# Patient Record
Sex: Male | Born: 1950
Health system: Southern US, Community
[De-identification: ages and names within clinical notes are randomized; demographics above are authoritative.]

## PROBLEM LIST (undated history)

## (undated) DIAGNOSIS — K219 Gastro-esophageal reflux disease without esophagitis: Secondary | ICD-10-CM

## (undated) DIAGNOSIS — N400 Enlarged prostate without lower urinary tract symptoms: Secondary | ICD-10-CM

## (undated) DIAGNOSIS — N200 Calculus of kidney: Secondary | ICD-10-CM

## (undated) DIAGNOSIS — I10 Essential (primary) hypertension: Secondary | ICD-10-CM

## (undated) HISTORY — PX: THYROID SURGERY: SHX805

## (undated) HISTORY — PX: ESOPHAGOGASTRODUODENOSCOPY: SHX1529

## (undated) HISTORY — DX: Essential (primary) hypertension: I10

## (undated) HISTORY — PX: HIATAL HERNIA REPAIR: SHX195

## (undated) HISTORY — DX: Benign prostatic hyperplasia without lower urinary tract symptoms: N40.0

## (undated) HISTORY — DX: Calculus of kidney: N20.0

## (undated) HISTORY — DX: Gastro-esophageal reflux disease without esophagitis: K21.9

---

## 2001-10-16 ENCOUNTER — Other Ambulatory Visit: Admission: RE | Admit: 2001-10-16 | Discharge: 2001-10-16 | Payer: Self-pay | Admitting: General Surgery

## 2002-01-09 ENCOUNTER — Ambulatory Visit (HOSPITAL_COMMUNITY): Admission: RE | Admit: 2002-01-09 | Discharge: 2002-01-10 | Payer: Self-pay | Admitting: Otolaryngology

## 2005-03-24 ENCOUNTER — Ambulatory Visit: Payer: Self-pay | Admitting: Cardiology

## 2005-03-31 ENCOUNTER — Inpatient Hospital Stay (HOSPITAL_BASED_OUTPATIENT_CLINIC_OR_DEPARTMENT_OTHER): Admission: RE | Admit: 2005-03-31 | Discharge: 2005-03-31 | Payer: Self-pay | Admitting: Cardiology

## 2005-03-31 ENCOUNTER — Ambulatory Visit: Payer: Self-pay | Admitting: Internal Medicine

## 2005-04-07 ENCOUNTER — Ambulatory Visit: Payer: Self-pay | Admitting: Cardiology

## 2008-05-28 HISTORY — PX: COLONOSCOPY: SHX174

## 2013-09-20 ENCOUNTER — Encounter: Payer: Self-pay | Admitting: Gastroenterology

## 2013-09-20 ENCOUNTER — Ambulatory Visit (INDEPENDENT_AMBULATORY_CARE_PROVIDER_SITE_OTHER): Payer: BC Managed Care – PPO | Admitting: Gastroenterology

## 2013-09-20 ENCOUNTER — Encounter (INDEPENDENT_AMBULATORY_CARE_PROVIDER_SITE_OTHER): Payer: Self-pay

## 2013-09-20 VITALS — BP 130/80 | HR 70 | Temp 97.6°F | Ht 70.0 in | Wt 254.8 lb

## 2013-09-20 DIAGNOSIS — K625 Hemorrhage of anus and rectum: Secondary | ICD-10-CM

## 2013-09-20 MED ORDER — PEG-KCL-NACL-NASULF-NA ASC-C 100 G PO SOLR: 1.0000 | ORAL | Status: DC

## 2013-09-20 NOTE — Progress Notes (Signed)
Primary Care Physician:  Ignatius Specking., MD  Primary Gastroenterologist:  Jonette Eva, MD   Chief Complaint  Patient presents with  . Hemorrhoids    HPI:  Stephen Hamilton is a 62 y.o. male here for further evaluation of rectal bleeding/hemorrhoids. Patient notes bleeding with and without BMs. May happen with walking/exercise. He has had couple of colonoscopies in the past, denies polyps. We have requested records. Normal colonoscopy with Dr. Erskine Speed in 2009. BM regular. No significant rectal pain. Heartburn controlled on PPI, 10 years of medication. Pepcid AC as needed. Prior EGD, patient denies Barrett's. No dysphagia.  Bleeding has become very frequent over the past 12 months. Has been intermittent for about 2-3 years. Has failed multiple hemorrhoidal preparations.   Current Outpatient Prescriptions  Medication Sig Dispense Refill  . JALYN 0.5-0.4 MG CAPS daily.       Marland Kitchen lisinopril (PRINIVIL,ZESTRIL) 20 MG tablet Take 20 mg by mouth daily.      Marland Kitchen omeprazole (PRILOSEC OTC) 20 MG tablet Take 20 mg by mouth daily.       No current facility-administered medications for this visit.    Allergies as of 09/20/2013  . (No Known Allergies)    Past Medical History  Diagnosis Date  . BPH (benign prostatic hyperplasia)   . HTN (hypertension)   . Kidney stone   . GERD (gastroesophageal reflux disease)     Past Surgical History  Procedure Laterality Date  . Hiatal hernia repair    . Thyroid surgery    . Colonoscopy  05/2008    Dr. Gabriel Cirri, normal  . Esophagogastroduodenoscopy      MMH per patient, unremarkable    Family History  Problem Relation Age of Onset  . Colon cancer Neg Hx     History   Social History  . Marital Status: Married    Spouse Name: N/A    Number of Children: N/A  . Years of Education: N/A   Occupational History  . retired Corporate treasurer   Social History Main Topics  . Smoking status: Current Every Day Smoker -- 1.00 packs/day    Types:  Cigarettes  . Smokeless tobacco: Not on file  . Alcohol Use: No  . Drug Use: No  . Sexual Activity: Not on file   Other Topics Concern  . Not on file   Social History Narrative  . No narrative on file      ROS:  General: Negative for anorexia, weight loss, fever, chills, fatigue, weakness. Eyes: Negative for vision changes.  ENT: Negative for hoarseness, difficulty swallowing , nasal congestion. CV: Negative for chest pain, angina, palpitations, dyspnea on exertion, peripheral edema.  Respiratory: Negative for dyspnea at rest, dyspnea on exertion, cough, sputum, wheezing.  GI: See history of present illness. GU:  Negative for dysuria, hematuria, urinary incontinence, + urinary frequency, + nocturnal urination.  MS: Negative for joint pain, low back pain.  Derm: Negative for rash or itching.  Neuro: Negative for weakness, abnormal sensation, seizure, frequent headaches, memory loss, confusion.  Psych: Negative for anxiety, depression, suicidal ideation, hallucinations.  Endo: Negative for unusual weight change.  Heme: Negative for bruising or bleeding. Allergy: Negative for rash or hives.    Physical Examination:  BP 130/80  Pulse 70  Temp(Src) 97.6 F (36.4 C) (Oral)  Ht 5\' 10"  (1.778 m)  Wt 254 lb 12.8 oz (115.577 kg)  BMI 36.56 kg/m2   General: Well-nourished, well-developed in no acute distress.  Head: Normocephalic, atraumatic.   Eyes: Conjunctiva pink, no  icterus. Mouth: Oropharyngeal mucosa moist and pink , no lesions erythema or exudate. Neck: Supple without thyromegaly, masses, or lymphadenopathy.  Lungs: Clear to auscultation bilaterally.  Heart: Regular rate and rhythm, no murmurs rubs or gallops.  Abdomen: Bowel sounds are normal, nontender, nondistended, no hepatosplenomegaly or masses, no abdominal bruits or    hernia , no rebound or guarding.   Rectal: Deferred Extremities: No lower extremity edema. No clubbing or deformities.  Neuro: Alert and  oriented x 4 , grossly normal neurologically.  Skin: Warm and dry, no rash or jaundice.   Psych: Alert and cooperative, normal mood and affect.

## 2013-09-20 NOTE — Patient Instructions (Signed)
1. We have scheduled you for a colonoscopy with possible hemorrhoid banding.

## 2013-09-20 NOTE — Assessment & Plan Note (Signed)
Chronic intermittent rectal bleeding likely benign anorectal source. His last colonoscopy is more than 5 years ago without reported hemorrhoids. We'll plan on colonoscopy with possible hemorrhoid banding times procedure as discussed with Dr. Darrick Penna.  I have discussed the risks, alternatives, benefits with regards to but not limited to the risk of reaction to medication, bleeding, infection, perforation and the patient is agreeable to proceed. Written consent to be obtained.

## 2013-09-23 NOTE — Progress Notes (Signed)
cc'd to PCP °

## 2013-09-24 ENCOUNTER — Encounter (HOSPITAL_COMMUNITY): Payer: Self-pay | Admitting: Pharmacy Technician

## 2013-09-25 ENCOUNTER — Encounter (HOSPITAL_COMMUNITY): Payer: Self-pay | Admitting: *Deleted

## 2013-09-25 ENCOUNTER — Encounter (HOSPITAL_COMMUNITY)
Admission: RE | Disposition: A | Discharge status: Home / Self Care | Payer: Self-pay | Source: Ambulatory Visit | Attending: Gastroenterology

## 2013-09-25 ENCOUNTER — Ambulatory Visit (HOSPITAL_COMMUNITY)
Admission: RE | Admit: 2013-09-25 | Discharge: 2013-09-25 | Disposition: A | Discharge status: Home / Self Care | Payer: BC Managed Care – PPO | Source: Ambulatory Visit | Attending: Gastroenterology | Admitting: Gastroenterology

## 2013-09-25 DIAGNOSIS — D126 Benign neoplasm of colon, unspecified: Secondary | ICD-10-CM

## 2013-09-25 DIAGNOSIS — K625 Hemorrhage of anus and rectum: Secondary | ICD-10-CM

## 2013-09-25 DIAGNOSIS — I10 Essential (primary) hypertension: Secondary | ICD-10-CM | POA: Insufficient documentation

## 2013-09-25 DIAGNOSIS — K648 Other hemorrhoids: Secondary | ICD-10-CM | POA: Insufficient documentation

## 2013-09-25 HISTORY — PX: COLONOSCOPY: SHX5424

## 2013-09-25 HISTORY — PX: HEMORRHOID BANDING: SHX5850

## 2013-09-25 SURGERY — COLONOSCOPY
Anesthesia: Moderate Sedation

## 2013-09-25 MED ORDER — STERILE WATER FOR IRRIGATION IR SOLN
Status: DC | PRN
  Administered 2013-09-25: 13:00:00

## 2013-09-25 MED ORDER — MIDAZOLAM HCL 5 MG/5ML IJ SOLN
INTRAMUSCULAR | Status: AC
  Filled 2013-09-25: qty 10

## 2013-09-25 MED ORDER — MIDAZOLAM HCL 5 MG/5ML IJ SOLN
INTRAMUSCULAR | Status: DC | PRN
  Administered 2013-09-25 (×2): 2 mg via INTRAVENOUS
  Administered 2013-09-25: 1 mg via INTRAVENOUS

## 2013-09-25 MED ORDER — SODIUM CHLORIDE 0.9 % IV SOLN
INTRAVENOUS | Status: DC
  Administered 2013-09-25: 11:00:00 via INTRAVENOUS

## 2013-09-25 MED ORDER — MEPERIDINE HCL 100 MG/ML IJ SOLN
INTRAMUSCULAR | Status: DC | PRN
  Administered 2013-09-25 (×3): 25 mg via INTRAVENOUS

## 2013-09-25 MED ORDER — MEPERIDINE HCL 100 MG/ML IJ SOLN
INTRAMUSCULAR | Status: AC
  Filled 2013-09-25: qty 2

## 2013-09-25 NOTE — Op Note (Addendum)
[facility] [address] [place], [address]   COLONOSCOPY PROCEDURE REPORT  PATIENT: Stephen Hamilton, Stephen Hamilton  MR#: [id number] BIRTHDATE: Aug 14, 1951 , 62  yrs. old GENDER: Male ENDOSCOPIST: Jonette Eva, MD REFERRED XB:JYNWG Sherril Croon, M.D. PROCEDURE DATE:  09/25/2013 PROCEDURE:   Colonoscopy with snare polypectomy and Hemorrhoidectomy via banding, clips or ligation INDICATIONS:Rectal Bleeding. MEDICATIONS: Demerol 50 mg IV and Versed 5 mg IV  DESCRIPTION OF PROCEDURE:    Physical exam was performed.  Informed consent was obtained from the patient after explaining the benefits, risks, and alternatives to procedure.  The patient was connected to monitor and placed in left lateral position. Continuous oxygen was provided by nasal cannula and IV medicine administered through an indwelling cannula.  After administration of sedation and rectal exam, the patients rectum was intubated and the EC-[id number] ([id number]) and [id number] ([id number])  colonoscope was advanced under direct visualization to the ileum.  The scope was removed slowly by carefully examining the color, texture, anatomy, and integrity mucosa on the way out.  The patient was recovered in endoscopy and discharged home in satisfactory condition.    COLON FINDINGS: The mucosa appeared normal in the terminal ileum.  , A single polyp measuring 8 mm in size was found in the ascending colon.  A polypectomy was performed using snare cautery.  , The colon mucosa was otherwise normal.  , Large internal hemorrhoids were found.  3 bands placed successfully. and The colon IS SLIGHTLY redundant.  Manual abdominal counter-pressure was used to reach the cecum.  PREP QUALITY: excellent.  CECAL W/D TIME: 12 minutes COMPLICATIONS: None  ENDOSCOPIC IMPRESSION: 1.   The colon was redundant 2.   ONE COLON POLYP REMOVED 3.   RECTAL BLEEDING DUE TO Large internal hemorrhoids   RECOMMENDATIONS: CALL [phone] IF YOU HAVE A FEVER, A  LARGE AMOUNT OF BLEEDING, OR DIFFICULTY URINATING and go to the ED immediately. DRINK WATER TO KEEP URINE LIGHT YELLOW. MAY USE NAPROXEN OR IBUPROFEN TWICE DAILY FOR RECTAL DISCOMFORT. TYLENOL AS NEED FOR ADDITIONAL PAIN RELIEF. MAY USE COLACE TWICE DAILY AS NEEDED TO SOFTEN STOOL. BIOPSY RESULT WILL BE BACK IN 7 DAYS.  FOLLOW A LOW RESIDUE DIET UNTIL NOV 12. FOLLOW UP NOV 19 AT 1130 PM. Next colonoscopy in 10 years.       _______________________________ Rosalie DoctorJonette Eva, MD 10/01/2013 1:47 PM Revised: 10/01/2013 1:47 PM    PATIENT NAME:  Stephen Hamilton, Stephen Hamilton MR#: [id number]

## 2013-09-25 NOTE — Progress Notes (Signed)
REVIEWED.  

## 2013-09-25 NOTE — H&P (Signed)
  Primary Care Physician:  Ignatius Specking., MD Primary Gastroenterologist:  Dr. Darrick Penna  Pre-Procedure History & Physical: HPI:  Stephen Hamilton is a 62 y.o. male here for  BRBPR.  Past Medical History  Diagnosis Date  . BPH (benign prostatic hyperplasia)   . HTN (hypertension)   . Kidney stone   . GERD (gastroesophageal reflux disease)     Past Surgical History  Procedure Laterality Date  . Hiatal hernia repair    . Thyroid surgery    . Colonoscopy  05/2008    Dr. Gabriel Cirri, normal  . Esophagogastroduodenoscopy      MMH per patient, unremarkable    Prior to Admission medications   Medication Sig Start Date End Date Taking? Authorizing Provider  JALYN 0.5-0.4 MG CAPS Take 1 capsule by mouth daily.  08/29/13  Yes Historical Provider, MD  lisinopril (PRINIVIL,ZESTRIL) 20 MG tablet Take 20 mg by mouth daily.   Yes Historical Provider, MD  omeprazole (PRILOSEC OTC) 20 MG tablet Take 20 mg by mouth daily.   Yes Historical Provider, MD    Allergies as of 09/20/2013  . (No Known Allergies)    Family History  Problem Relation Age of Onset  . Colon cancer Neg Hx     History   Social History  . Marital Status: Married    Spouse Name: N/A    Number of Children: N/A  . Years of Education: N/A   Occupational History  . retired Corporate treasurer   Social History Main Topics  . Smoking status: Current Every Day Smoker -- 1.00 packs/day    Types: Cigarettes  . Smokeless tobacco: Not on file  . Alcohol Use: No  . Drug Use: No  . Sexual Activity: Not on file   Other Topics Concern  . Not on file   Social History Narrative  . No narrative on file    Review of Systems: See HPI, otherwise negative ROS   Physical Exam: BP 146/92  Pulse 68  Temp(Src) 98.7 F (37.1 C) (Oral)  Resp 16  Ht 5\' 10"  (1.778 m)  Wt 254 lb (115.214 kg)  BMI 36.45 kg/m2  SpO2 98% General:   Alert,  pleasant and cooperative in NAD Head:  Normocephalic and atraumatic. Neck:  Supple; Lungs:   Clear throughout to auscultation.    Heart:  Regular rate and rhythm. Abdomen:  Soft, nontender and nondistended. Normal bowel sounds, without guarding, and without rebound.   Neurologic:  Alert and  oriented x4;  grossly normal neurologically.  Impression/Plan:    BRBPR  PLAN: TCS/?hemorrhoid banding TODAY

## 2013-09-27 ENCOUNTER — Encounter (HOSPITAL_COMMUNITY): Payer: Self-pay | Admitting: Gastroenterology

## 2013-10-07 ENCOUNTER — Telehealth: Payer: Self-pay | Admitting: Gastroenterology

## 2013-10-07 NOTE — Telephone Encounter (Signed)
Please call pt. Stephen Hamilton had A simple adenoma removed from HIS colon.    DRINK WATER TO KEEP URINE LIGHT YELLOW.  USE NAPROXEN OR IBUPROFEN TWICE DAILY FOR RECTAL DISCOMFORT. TYLENOL AS NEED FOR ADDITIONAL PAIN RELIEF.  USE COLACE TWICE DAILY AS NEEDED TO SOFTEN STOOL.  FOLLOW A LOW RESIDUE DIET UNTIL NOV 12 THEN FOLLOW A HIGH FIBER DIET.  FOLLOW UP NOV 19 AT 1130 PM.  Next colonoscopy in 10 years.

## 2013-10-07 NOTE — Telephone Encounter (Signed)
LM for pt to call

## 2013-10-08 NOTE — Telephone Encounter (Signed)
Pt returned call and was informed of results.  

## 2013-10-16 ENCOUNTER — Encounter: Payer: Self-pay | Admitting: Gastroenterology

## 2013-10-16 ENCOUNTER — Ambulatory Visit (INDEPENDENT_AMBULATORY_CARE_PROVIDER_SITE_OTHER): Payer: BC Managed Care – PPO | Admitting: Gastroenterology

## 2013-10-16 ENCOUNTER — Other Ambulatory Visit: Payer: Self-pay | Admitting: Gastroenterology

## 2013-10-16 VITALS — BP 147/93 | HR 74 | Temp 98.5°F | Ht 70.0 in | Wt 256.8 lb

## 2013-10-16 DIAGNOSIS — K648 Other hemorrhoids: Secondary | ICD-10-CM

## 2013-10-16 DIAGNOSIS — N400 Enlarged prostate without lower urinary tract symptoms: Secondary | ICD-10-CM

## 2013-10-16 DIAGNOSIS — D126 Benign neoplasm of colon, unspecified: Secondary | ICD-10-CM

## 2013-10-16 NOTE — Assessment & Plan Note (Signed)
SX NOT IDEALLY CONTROLLED ON RAPAFLO OR JAYLYN.  REFER TO [facility]. OPV IN 1-2 YEARS

## 2013-10-16 NOTE — Progress Notes (Signed)
Cc PCP 

## 2013-10-16 NOTE — Assessment & Plan Note (Signed)
EAT FIBER. DISCUSSED EXAMPLES OF HIGH FIBER FOODS. DRINK WATER TCSIN 10 YEARS

## 2013-10-16 NOTE — Assessment & Plan Note (Signed)
SX RESOLVED AFTER TCS/IH BANDING X3.  DISCUSSED INFO ON NEED FOR FUTURE INTERVENTION AND HOW TO AVOID HEMORRHOID FLARES DRINK WATER EAT FIBER OPV IN 1 YEAR

## 2013-10-16 NOTE — Patient Instructions (Signed)
CONTINUE YOUR WEIGHT LOSS EFFORTS. A WEIGHT OF 200-210 LBS WOULD BE BETTER FOR YOUR HEALTH.  DRINK WATER TO KEEP YOUR URINE LIGHT YELLOW.  FOLLOW A HIGH FIBER/LOW FAT DIET. SEE INFO BELOW.  I WILL MAKE THE REFERRAL TO [facility].  FOLLOW UP IN ONE YEAR.  Hemorrhoids Hemorrhoids are dilated (enlarged) veins around the rectum. Sometimes clots will form in the veins. This makes them swollen and painful. These are called thrombosed hemorrhoids.  Causes of hemorrhoids include:  Constipation.   Straining to have a bowel movement.   HEAVY LIFTING  HOME CARE INSTRUCTIONS  Eat a well balanced diet and drink 6 to 8 glasses of water every day to avoid constipation. You may also use a bulk laxative.   Avoid straining to have bowel movements. DO NOT SIT ON THE TOILET MORE THAN 5 MINUTES.  Keep anal area dry and clean.   Do not use a donut shaped pillow or sit on the toilet for long periods. This increases blood pooling and pain.   Move your bowels when your body has the urge; this will require less straining and will decrease pain and pressure.   High-Fiber Diet A high-fiber diet changes your normal diet to include more whole grains, legumes, fruits, and vegetables. Changes in the diet involve replacing refined carbohydrates with unrefined foods. The calorie level of the diet is essentially unchanged. The Dietary Reference Intake (recommended amount) for adult males is 38 grams per day. For adult females, it is 25 grams per day. Pregnant and lactating women should consume 28 grams of fiber per day. Fiber is the intact part of a plant that is not broken down during digestion. Functional fiber is fiber that has been isolated from the plant to provide a beneficial effect in the body. PURPOSE  Increase stool bulk.   Ease and regulate bowel movements.   Lower cholesterol.  INDICATIONS THAT YOU NEED MORE FIBER  Constipation and hemorrhoids.   Uncomplicated diverticulosis (intestine  condition) and irritable bowel syndrome.   Weight management.   As a protective measure against hardening of the arteries (atherosclerosis), diabetes, and cancer.   GUIDELINES FOR INCREASING FIBER IN THE DIET  Start adding fiber to the diet slowly. A gradual increase of about 5 more grams (2 slices of whole-wheat bread, 2 servings of most fruits or vegetables, or 1 bowl of high-fiber cereal) per day is best. Too rapid an increase in fiber may result in constipation, flatulence, and bloating.   Drink enough water and fluids to keep your urine clear or pale yellow. Water, juice, or caffeine-free drinks are recommended. Not drinking enough fluid may cause constipation.   Eat a variety of high-fiber foods rather than one type of fiber.   Try to increase your intake of fiber through using high-fiber foods rather than fiber pills or supplements that contain small amounts of fiber.   The goal is to change the types of food eaten. Do not supplement your present diet with high-fiber foods, but replace foods in your present diet.  INCLUDE A VARIETY OF FIBER SOURCES  Replace refined and processed grains with whole grains, canned fruits with fresh fruits, and incorporate other fiber sources. White rice, white breads, and most bakery goods contain little or no fiber.   Brown whole-grain rice, buckwheat oats, and many fruits and vegetables are all good sources of fiber. These include: broccoli, Brussels sprouts, cabbage, cauliflower, beets, sweet potatoes, white potatoes (skin on), carrots, tomatoes, eggplant, squash, berries, fresh fruits, and dried fruits.  Cereals appear to be the richest source of fiber. Cereal fiber is found in whole grains and bran. Bran is the fiber-rich outer coat of cereal grain, which is largely removed in refining. In whole-grain cereals, the bran remains. In breakfast cereals, the largest amount of fiber is found in those with "bran" in their names. The fiber content is  sometimes indicated on the label.   You may need to include additional fruits and vegetables each day.   In baking, for 1 cup white flour, you may use the following substitutions:   1 cup whole-wheat flour minus 2 tablespoons.   1/2 cup white flour plus 1/2 cup whole-wheat flour.   Low-Fat Diet BREADS, CEREALS, PASTA, RICE, DRIED PEAS, AND BEANS These products are high in carbohydrates and most are low in fat. Therefore, they can be increased in the diet as substitutes for fatty foods. They too, however, contain calories and should not be eaten in excess. Cereals can be eaten for snacks as well as for breakfast.   FRUITS AND VEGETABLES It is good to eat fruits and vegetables. Besides being sources of fiber, both are rich in vitamins and some minerals. They help you get the daily allowances of these nutrients. Fruits and vegetables can be used for snacks and desserts.  MEATS Limit lean meat, chicken, [place], and fish to no more than 6 ounces per day. Beef, Pork, and Lamb Use lean cuts of beef, pork, and lamb. Lean cuts include:  Extra-lean ground beef.  Arm roast.  Sirloin tip.  Center-cut ham.  Round steak.  Loin chops.  Rump roast.  Tenderloin.  Trim all fat off the outside of meats before cooking. It is not necessary to severely decrease the intake of red meat, but lean choices should be made. Lean meat is rich in protein and contains a highly absorbable form of iron. Premenopausal women, in particular, should avoid reducing lean red meat because this could increase the risk for low red blood cells (iron-deficiency anemia).  Chicken and [place] These are good sources of protein. The fat of poultry can be reduced by removing the skin and underlying fat layers before cooking. Chicken and [place] can be substituted for lean red meat in the diet. Poultry should not be fried or covered with high-fat sauces. Fish and Shellfish Fish is a good source of protein. Shellfish contain  cholesterol, but they usually are low in saturated fatty acids. The preparation of fish is important. Like chicken and [place], they should not be fried or covered with high-fat sauces. EGGS Egg whites contain no fat or cholesterol. They can be eaten often. Try 1 to 2 egg whites instead of whole eggs in recipes or use egg substitutes that do not contain yolk. MILK AND DAIRY PRODUCTS Use skim or 1% milk instead of 2% or whole milk. Decrease whole milk, natural, and processed cheeses. Use nonfat or low-fat (2%) cottage cheese or low-fat cheeses made from vegetable oils. Choose nonfat or low-fat (1 to 2%) yogurt. Experiment with evaporated skim milk in recipes that call for heavy cream. Substitute low-fat yogurt or low-fat cottage cheese for sour cream in dips and salad dressings. Have at least 2 servings of low-fat dairy products, such as 2 glasses of skim (or 1%) milk each day to help get your daily calcium intake. FATS AND OILS Reduce the total intake of fats, especially saturated fat. Butterfat, lard, and beef fats are high in saturated fat and cholesterol. These should be avoided as much as possible. Vegetable  fats do not contain cholesterol, but certain vegetable fats, such as coconut oil, palm oil, and palm kernel oil are very high in saturated fats. These should be limited. These fats are often used in bakery goods, processed foods, popcorn, oils, and nondairy creamers. Vegetable shortenings and some peanut butters contain hydrogenated oils, which are also saturated fats. Read the labels on these foods and check for saturated vegetable oils. Unsaturated vegetable oils and fats do not raise blood cholesterol. However, they should be limited because they are fats and are high in calories. Total fat should still be limited to 30% of your daily caloric intake. Desirable liquid vegetable oils are corn oil, cottonseed oil, olive oil, canola oil, safflower oil, soybean oil, and sunflower oil. Peanut oil is not  as good, but small amounts are acceptable. Buy a heart-healthy tub margarine that has no partially hydrogenated oils in the ingredients. Mayonnaise and salad dressings often are made from unsaturated fats, but they should also be limited because of their high calorie and fat content. Seeds, nuts, peanut butter, olives, and avocados are high in fat, but the fat is mainly the unsaturated type. These foods should be limited mainly to avoid excess calories and fat. OTHER EATING TIPS Snacks  Most sweets should be limited as snacks. They tend to be rich in calories and fats, and their caloric content outweighs their nutritional value. Some good choices in snacks are graham crackers, melba toast, soda crackers, bagels (no egg), English muffins, fruits, and vegetables. These snacks are preferable to snack crackers, [place] fries, TORTILLA CHIPS, and POTATO chips. Popcorn should be air-popped or cooked in small amounts of liquid vegetable oil. Desserts Eat fruit, low-fat yogurt, and fruit ices instead of pastries, cake, and cookies. Sherbet, angel food cake, gelatin dessert, frozen low-fat yogurt, or other frozen products that do not contain saturated fat (pure fruit juice bars, frozen ice pops) are also acceptable.  COOKING METHODS Choose those methods that use little or no fat. They include: Poaching.  Braising.  Steaming.  Grilling.  Baking.  Stir-frying.  Broiling.  Microwaving.  Foods can be cooked in a nonstick pan without added fat, or use a nonfat cooking spray in regular cookware. Limit fried foods and avoid frying in saturated fat. Add moisture to lean meats by using water, broth, cooking wines, and other nonfat or low-fat sauces along with the cooking methods mentioned above. Soups and stews should be chilled after cooking. The fat that forms on top after a few hours in the refrigerator should be skimmed off. When preparing meals, avoid using excess salt. Salt can contribute to raising blood  pressure in some people.  EATING AWAY FROM HOME Order entres, potatoes, and vegetables without sauces or butter. When meat exceeds the size of a deck of cards (3 to 4 ounces), the rest can be taken home for another meal. Choose vegetable or fruit salads and ask for low-calorie salad dressings to be served on the side. Use dressings sparingly. Limit high-fat toppings, such as bacon, crumbled eggs, cheese, sunflower seeds, and olives. Ask for heart-healthy tub margarine instead of butter.

## 2013-10-16 NOTE — Progress Notes (Signed)
Primary Care Physician:  Ignatius Specking., MD Primary Gastroenterologist:  Dr. Darrick Penna  Pre-Procedure History & Physical: HPI:  Stephen Hamilton is a 62 y.o. male here for AFTER IH BANDING. BUTT WAS UNCOMFORTABLE FOR A FEW DAYS. SML AMOUNT OF BLEEDING. SX RESOLVED. BMs: DAILY. PLAYING GOLF. JUST GOT [place] FROM [place]. Continue to c/o frequent urination and UP AT NIGHT 2-3 TIMES TO URINATE. NO RECTAL BLEEDING, PRESSURE, PAIN, ITCHING, BURNING, OR SOILING.  Past Medical History  Diagnosis Date  . BPH (benign prostatic hyperplasia)   . HTN (hypertension)   . Kidney stone   . GERD (gastroesophageal reflux disease)    Past Surgical History  Procedure Laterality Date  . Hiatal hernia repair    . Thyroid surgery    . Colonoscopy  05/2008    Dr. Gabriel Cirri, normal  . Esophagogastroduodenoscopy      MMH per patient, unremarkable  . Colonoscopy N/A 09/25/2013    Procedure: COLONOSCOPY;  Surgeon: West Bali, MD;  Location: [facility];  Service: Endoscopy;  Laterality: N/A;  12:15  . Hemorrhoid banding N/A 09/25/2013    Procedure: HEMORRHOID BANDING;  Surgeon: West Bali, MD;  Location: [facility];  Service: Endoscopy;  Laterality: N/A;   Prior to Admission medications   Medication Sig Start Date End Date Taking? Authorizing Provider  lisinopril (PRINIVIL,ZESTRIL) 20 MG tablet Take 20 mg by mouth daily.   Yes Historical Provider, MD  NON FORMULARY New prostate med/ ? name   Yes Historical Provider, MD  omeprazole (PRILOSEC OTC) 20 MG tablet Take 20 mg by mouth daily.   Yes Historical Provider, MD  RAPAFLO QAM       Allergies as of 10/16/2013  . (No Known Allergies)   Family History  Problem Relation Age of Onset  . Colon cancer Neg Hx    History   Social History  . Marital Status: Married    Spouse Name: N/A    Number of Children: N/A  . Years of Education: N/A   Occupational History  . retired Corporate treasurer   Social History Main Topics  . Smoking status:  Current Every Day Smoker -- 1.00 packs/day    Types: Cigarettes  . Smokeless tobacco: Not on file  . Alcohol Use: No  . Drug Use: No  . Sexual Activity: Not on file   Review of Systems: See HPI, otherwise negative ROS  Physical Exam: BP 147/93  Pulse 74  Temp(Src) 98.5 F (36.9 C) (Oral)  Ht 5\' 10"  (1.778 m)  Wt 256 lb 12.8 oz (116.484 kg)  BMI 36.85 kg/m2 General:   Alert,  pleasant and cooperative in NAD Head:  Normocephalic and atraumatic. Neck:  Supple; Lungs:  Clear throughout to auscultation.    Heart:  Regular rate and rhythm. Abdomen:  Soft, nontender and nondistended. Normal bowel sounds, without guarding, and without rebound.   Neurologic:  Alert and  oriented x4;  grossly normal neurologically.  Impression/Plan:

## 2013-10-17 NOTE — Progress Notes (Signed)
Reminder in epic °

## 2013-12-03 ENCOUNTER — Ambulatory Visit (INDEPENDENT_AMBULATORY_CARE_PROVIDER_SITE_OTHER): Payer: BC Managed Care – PPO | Admitting: Urology

## 2013-12-03 DIAGNOSIS — N433 Hydrocele, unspecified: Secondary | ICD-10-CM

## 2013-12-03 DIAGNOSIS — R351 Nocturia: Secondary | ICD-10-CM

## 2013-12-03 DIAGNOSIS — R350 Frequency of micturition: Secondary | ICD-10-CM

## 2013-12-03 DIAGNOSIS — N529 Male erectile dysfunction, unspecified: Secondary | ICD-10-CM

## 2013-12-03 DIAGNOSIS — R3915 Urgency of urination: Secondary | ICD-10-CM

## 2014-01-07 ENCOUNTER — Ambulatory Visit (INDEPENDENT_AMBULATORY_CARE_PROVIDER_SITE_OTHER): Payer: BC Managed Care – PPO | Admitting: Urology

## 2014-01-07 DIAGNOSIS — R3915 Urgency of urination: Secondary | ICD-10-CM

## 2014-04-08 ENCOUNTER — Ambulatory Visit (INDEPENDENT_AMBULATORY_CARE_PROVIDER_SITE_OTHER): Payer: BC Managed Care – PPO | Admitting: Urology

## 2014-04-08 DIAGNOSIS — R350 Frequency of micturition: Secondary | ICD-10-CM

## 2014-04-08 DIAGNOSIS — R351 Nocturia: Secondary | ICD-10-CM

## 2014-09-11 ENCOUNTER — Encounter: Payer: Self-pay | Admitting: Gastroenterology

## 2014-11-19 ENCOUNTER — Encounter: Payer: Self-pay | Admitting: Gastroenterology

## 2014-11-19 ENCOUNTER — Ambulatory Visit (INDEPENDENT_AMBULATORY_CARE_PROVIDER_SITE_OTHER): Payer: BC Managed Care – PPO | Admitting: Gastroenterology

## 2014-11-19 VITALS — BP 153/89 | HR 66 | Temp 98.6°F | Ht 70.0 in | Wt 262.0 lb

## 2014-11-19 DIAGNOSIS — K648 Other hemorrhoids: Secondary | ICD-10-CM

## 2014-11-19 MED ORDER — HYDROCORTISONE ACETATE 25 MG RE SUPP: 25.0000 mg | Freq: Two times a day (BID) | RECTAL | Status: DC

## 2014-11-19 NOTE — Patient Instructions (Signed)
DRINK WATER TO KEEP YOUR URINE LIGHT YELLOW.  FOLLOW A HIGH FIBER DIET. SEE INFO BELOW.  ANUSOL HC SUPP TWICE DAILY TO TREAT RECTAL BLEEDING/PAIN. DO NOT USE MORE THAN 3-4 TIMES A YEAR.  PLEASE CALL WHEN YOU WOULD LIKE TO PURSUE BANDING.  FOLLOW UP IN 6 MOS.

## 2014-11-19 NOTE — Progress Notes (Signed)
ON RECALL LIST  °

## 2014-11-19 NOTE — Progress Notes (Signed)
   Subjective:    Patient ID: Stephen Hamilton, male    DOB: May 17, 1951, 63 y.o.   MRN: [id number]  VYAS,DHRUV B., MD  HPI MAY SEE BRBPR FOR 2-3 DAYS EVERY 2-3 WEEKS. WHEN HE WIPES. RARELY CAN COAT TISSUE AND OTHER TIMES IT'S JUST A SMALL AMOUNT. MAY SEE IN TOILET WATER. DOESN;T REALLY USE CREAM OR SUPP. PREP H DOESN'T SEEM TO HELP. CONSTIPATION: 1X/MO. MAY BE ASSOCIATED WITH MILD RECTAL DISCOMFORT. NO RECTAL PRESSURE, ITCHING, BURNING, OR SOILING. NOTHING DROPPING DOWN AND POPS BACK UP. PHYSICAL ACTIVITY: GOLF(RIDES > WALKS). BMs: ONCE A DAY. DOESN'T REALLY STRAIN. DRINK WATER/EATS FIBER. PRILOSEC/PECID PRN WORKS FOR HEARTBURN.   PT DENIES FEVER, CHILLS, nausea, vomiting, melena, diarrhea, CHEST PAIN, SHORTNESS OF BREATH,  constipation, abdominal pain, problems swallowing, OR heartburn or indigestion.  Past Medical History  Diagnosis Date  . BPH (benign prostatic hyperplasia)   . HTN (hypertension)   . Kidney stone   . GERD (gastroesophageal reflux disease)    Past Surgical History  Procedure Laterality Date  . Hiatal hernia repair    . Thyroid surgery    . Colonoscopy  05/2008    Dr. Anthony Sar, normal  . Esophagogastroduodenoscopy      [place] per patient, unremarkable  . Colonoscopy N/A 09/25/2013    Procedure: COLONOSCOPY;  Surgeon: Danie Binder, MD;  [facility];  Service: Endoscopy;  Laterality: N/A;  12:15  . Hemorrhoid banding N/A 09/25/2013    Procedure: HEMORRHOID BANDING;  Surgeon: Danie Binder, MD;  [facility];  Service: Endoscopy;  Laterality: N/A;   Allergies  Allergen Reactions  . Other     Had reaction to prostate medication.   Current Outpatient Prescriptions  Medication Sig Dispense Refill  . omeprazole (PRILOSEC OTC) 20 MG tablet Take 20 mg by mouth daily.    . silodosin (RAPAFLO) 4 MG CAPS capsule Take 4 mg by mouth daily with breakfast.    . lisinopril (PRINIVIL,ZESTRIL) 20 MG tablet Take 20 mg by mouth daily.    . NON FORMULARY New  prostate med/ ? name       Review of Systems     Objective:   Physical Exam  Constitutional: He is oriented to person, place, and time. He appears well-developed and well-nourished. No distress.  HENT:  Head: Normocephalic and atraumatic.  Mouth/Throat: Oropharynx is clear and moist. No oropharyngeal exudate.  Eyes: Pupils are equal, round, and reactive to light. No scleral icterus.  Neck: Normal range of motion. Neck supple.  Cardiovascular: Normal rate, regular rhythm and normal heart sounds.   Pulmonary/Chest: Effort normal and breath sounds normal. No respiratory distress.  Abdominal: Soft. Bowel sounds are normal. He exhibits no distension. There is no tenderness.  Musculoskeletal: He exhibits no edema.  Lymphadenopathy:    He has no cervical adenopathy.  Neurological: He is alert and oriented to person, place, and time.  NO FOCAL DEFICITS   Psychiatric: He has a normal mood and affect.  Vitals reviewed.         Assessment & Plan:

## 2014-11-19 NOTE — Assessment & Plan Note (Signed)
Sx not ideally controlled.  ANUSOL HC SUPP BID PRN. DISCUSSED RISK OF USING > 3-4 TIMES A YEAR. CONSIDER FLEX SIG/IH BANDING OR [facility] BANDING. DISCUSSED PROCEDURE, BENEFITS, AND MANAGEMENT OF HEMORRHOID BANDING. PT WILL CALL WHEN HE WOULD LIKE TO PURSUE BANDING FOLLOW UP IN 6 MOS.

## 2014-11-25 NOTE — Progress Notes (Signed)
cc'ed to pcp °

## 2014-12-01 NOTE — Progress Notes (Unsigned)
Patient ID: Stephen Hamilton, male   DOB: 1951/02/15, 64 y.o.   MRN: [id number] Pre [employer] patient does not need a PA for the hemorrhoid banding.

## 2014-12-24 ENCOUNTER — Ambulatory Visit (INDEPENDENT_AMBULATORY_CARE_PROVIDER_SITE_OTHER): Payer: BLUE CROSS/BLUE SHIELD | Admitting: Gastroenterology

## 2014-12-24 ENCOUNTER — Encounter: Payer: Self-pay | Admitting: Gastroenterology

## 2014-12-24 VITALS — BP 144/93 | HR 61 | Temp 97.4°F | Ht 70.0 in | Wt 264.4 lb

## 2014-12-24 DIAGNOSIS — L209 Atopic dermatitis, unspecified: Secondary | ICD-10-CM | POA: Insufficient documentation

## 2014-12-24 DIAGNOSIS — K648 Other hemorrhoids: Secondary | ICD-10-CM

## 2014-12-24 MED ORDER — HYDROCORTISONE 2.5 % RE CREA: 1.0000 "application " | TOPICAL_CREAM | Freq: Two times a day (BID) | RECTAL | Status: DC

## 2014-12-24 NOTE — Progress Notes (Signed)
Primary Care Physician:  Glenda Chroman., MD Primary Gastroenterologist:  Dr. Oneida Alar  Pre-Procedure History & Physical: HPI:  Stephen Hamilton is a 64 y.o. male here for rectal bleeding/pain. Once in a while bleeding. No pain or itching. Didn't get suppositories because they were >$200 out of pocket. PENDING APPT WITH UROLOGY MAR 1. BMs: INCE A DAY.  PT DENIES FEVER, CHILLS, nausea, vomiting, melena, diarrhea, CHEST PAIN, SHORTNESS OF BREATH,  CHANGE IN BOWEL IN HABITS, constipation, abdominal pain, problems swallowing,OR heartburn or indigestion.   Past Medical History  Diagnosis Date  . BPH (benign prostatic hyperplasia)   . HTN (hypertension)   . Kidney stone   . GERD (gastroesophageal reflux disease)     Past Surgical History  Procedure Laterality Date  . Hiatal hernia repair    . Thyroid surgery    . Colonoscopy  05/2008    Dr. Anthony Sar, normal  . Esophagogastroduodenoscopy      [place] per patient, unremarkable  . Colonoscopy N/A 09/25/2013    SLF: 1. the colon was redundant 2. One colon polyp removed  3.  Rectal bleeding due to large internal hemorrhoids  . Hemorrhoid banding N/A 09/25/2013    Procedure: HEMORRHOID BANDING;  Surgeon: Danie Binder, MD;  [facility];  Service: Endoscopy;  Laterality: N/A;    Prior to Admission medications   Medication Sig Start Date End Date Taking? Authorizing Provider  hydrocortisone (ANUSOL-HC) 25 MG suppository Place 1 suppository (25 mg total) rectally every 12 (twelve) hours. For 12 days 11/19/14  Yes Danie Binder, MD  lisinopril (PRINIVIL,ZESTRIL) 20 MG tablet Take 20 mg by mouth daily.   Yes Historical Provider, MD  NON FORMULARY New prostate med/ ? name   Yes Historical Provider, MD  omeprazole (PRILOSEC OTC) 20 MG tablet Take 20 mg by mouth daily.   Yes Historical Provider, MD  silodosin (RAPAFLO) 4 MG CAPS capsule Take 4 mg by mouth daily with breakfast.   Yes Historical Provider, MD   Allergies as of 12/24/2014 - Review  Complete 12/24/2014  Allergen Reaction Noted  . Other  11/19/2014   Family History  Problem Relation Age of Onset  . Colon cancer Neg Hx    History   Social History  . Marital Status: Married    Spouse Name: N/A    Number of Children: N/A  . Years of Education: N/A   Occupational History  . retired [place] History Main Topics  . Smoking status: Current Every Day Smoker -- 1.00 packs/day    Types: Cigarettes  . Smokeless tobacco: Not on file  . Alcohol Use: No  . Drug Use: No  . Sexual Activity: Not on file   Other Topics Concern  . Not on file   Social History Narrative    Review of Systems: See HPI, otherwise negative ROS   Physical Exam: BP 144/93 mmHg  Pulse 61  Temp(Src) 97.4 F (36.3 C) (Oral)  Ht 5\' 10"  (1.778 m)  Wt 264 lb 6.4 oz (119.931 kg)  BMI 37.94 kg/m2 General:   Alert,  pleasant and cooperative in NAD Head:  Normocephalic and atraumatic. Neck:  Supple; Lungs:  Clear throughout to auscultation.    Heart:  Regular rate and rhythm. Abdomen:  Soft, nontender and nondistended. Normal bowel sounds, without guarding, and without rebound.   Neurologic:  Alert and  oriented x4;  grossly normal neurologically.  Impression/Plan:

## 2014-12-24 NOTE — Patient Instructions (Addendum)
PLEASE CALL FOR FLEXIBLE SIGMOIDOSCOPY WITH HEMORRHOID BANDING IF YOUR SYMPTOMS BECOME MORE FREQUENT.  DRINK WATER TO KEEP YOUR URINE LIGHT YELLOW.  FOLLOW A HIGH FIBER DIET. SEE INFO BELOW.  I SENT A PRERSCRIOPTION FOR PROCTOZONE TO YOUR PHARMACY.  USE HYDROCORTISONE(HCT) OTC CREAM THREE TIMES A DAY FOR 14 DAYS ON YOUR LEFT SIDE. APPLY EUCERIN CREAM AFTER EACH HCT DOSE.  FOLLOW UP IN 3 MOS.    Hemorrhoids Hemorrhoids are dilated (enlarged) veins around the rectum. Sometimes clots will form in the veins. This makes them swollen and painful. These are called thrombosed hemorrhoids. Causes of hemorrhoids include:  Constipation.   Straining to have a bowel movement.   HEAVY LIFTING HOME CARE INSTRUCTIONS  Eat a well balanced diet and drink 6 to 8 glasses of water every day to avoid constipation. You may also use a bulk laxative.   Avoid straining to have bowel movements.   Keep anal area dry and clean.   Do not use a donut shaped pillow or sit on the toilet for long periods. This increases blood pooling and pain.   Move your bowels when your body has the urge; this will require less straining and will decrease pain and pressure.     High-Fiber Diet A high-fiber diet changes your normal diet to include more whole grains, legumes, fruits, and vegetables. Changes in the diet involve replacing refined carbohydrates with unrefined foods. The calorie level of the diet is essentially unchanged. The Dietary Reference Intake (recommended amount) for adult males is 38 grams per day. For adult females, it is 25 grams per day. Pregnant and lactating women should consume 28 grams of fiber per day. Fiber is the intact part of a plant that is not broken down during digestion. Functional fiber is fiber that has been isolated from the plant to provide a beneficial effect in the body. PURPOSE  Increase stool bulk.   Ease and regulate bowel movements.   Lower cholesterol.  INDICATIONS THAT  YOU NEED MORE FIBER  Constipation and hemorrhoids.   Uncomplicated diverticulosis (intestine condition) and irritable bowel syndrome.   Weight management.   As a protective measure against hardening of the arteries (atherosclerosis), diabetes, and cancer.   GUIDELINES FOR INCREASING FIBER IN THE DIET  Start adding fiber to the diet slowly. A gradual increase of about 5 more grams (2 slices of whole-wheat bread, 2 servings of most fruits or vegetables, or 1 bowl of high-fiber cereal) per day is best. Too rapid an increase in fiber may result in constipation, flatulence, and bloating.   Drink enough water and fluids to keep your urine clear or pale yellow. Water, juice, or caffeine-free drinks are recommended. Not drinking enough fluid may cause constipation.   Eat a variety of high-fiber foods rather than one type of fiber.   Try to increase your intake of fiber through using high-fiber foods rather than fiber pills or supplements that contain small amounts of fiber.   The goal is to change the types of food eaten. Do not supplement your present diet with high-fiber foods, but replace foods in your present diet.  INCLUDE A VARIETY OF FIBER SOURCES  Replace refined and processed grains with whole grains, canned fruits with fresh fruits, and incorporate other fiber sources. White rice, white breads, and most bakery goods contain little or no fiber.   Brown whole-grain rice, buckwheat oats, and many fruits and vegetables are all good sources of fiber. These include: broccoli, Brussels sprouts, cabbage, cauliflower, beets, sweet potatoes,  white potatoes (skin on), carrots, tomatoes, eggplant, squash, berries, fresh fruits, and dried fruits.   Cereals appear to be the richest source of fiber. Cereal fiber is found in whole grains and bran. Bran is the fiber-rich outer coat of cereal grain, which is largely removed in refining. In whole-grain cereals, the bran remains. In breakfast cereals, the  largest amount of fiber is found in those with "bran" in their names. The fiber content is sometimes indicated on the label.   You may need to include additional fruits and vegetables each day.   In baking, for 1 cup white flour, you may use the following substitutions:   1 cup whole-wheat flour minus 2 tablespoons.   1/2 cup white flour plus 1/2 cup whole-wheat flour.

## 2014-12-24 NOTE — Assessment & Plan Note (Signed)
  USE HYDROCORTISONE(HCT) OTC CREAM THREE TIMES A DAY FOR 14 DAYS ON YOUR LEFT SIDE. APPLY EUCERIN CREAM AFTER EACH HCT DOSE.

## 2014-12-24 NOTE — Assessment & Plan Note (Signed)
Sx controlled at the moment.  PT WILL CALL FOR FLEX SIG/IH BANDING IF SX MORE FREQUENT(1X/WK) DRINK WATER EAT FIBER.  HO GIVEN. FOLLOW UP IN 3 MOS.

## 2015-01-27 ENCOUNTER — Ambulatory Visit (INDEPENDENT_AMBULATORY_CARE_PROVIDER_SITE_OTHER): Payer: BLUE CROSS/BLUE SHIELD | Admitting: Urology

## 2015-01-27 DIAGNOSIS — R351 Nocturia: Secondary | ICD-10-CM

## 2015-01-27 DIAGNOSIS — R350 Frequency of micturition: Secondary | ICD-10-CM

## 2015-01-27 DIAGNOSIS — N529 Male erectile dysfunction, unspecified: Secondary | ICD-10-CM | POA: Diagnosis not present

## 2015-03-20 ENCOUNTER — Other Ambulatory Visit: Payer: Self-pay

## 2015-03-20 MED ORDER — HYDROCORTISONE 2.5 % RE CREA: 1.0000 "application " | TOPICAL_CREAM | Freq: Two times a day (BID) | RECTAL | Status: DC

## 2015-03-24 ENCOUNTER — Ambulatory Visit (INDEPENDENT_AMBULATORY_CARE_PROVIDER_SITE_OTHER): Payer: BLUE CROSS/BLUE SHIELD | Admitting: Urology

## 2015-03-24 DIAGNOSIS — N529 Male erectile dysfunction, unspecified: Secondary | ICD-10-CM

## 2015-03-24 DIAGNOSIS — R350 Frequency of micturition: Secondary | ICD-10-CM

## 2015-04-09 ENCOUNTER — Encounter: Payer: Self-pay | Admitting: Gastroenterology

## 2016-02-15 ENCOUNTER — Other Ambulatory Visit: Payer: Self-pay | Admitting: Gastroenterology

## 2016-09-13 DIAGNOSIS — Z1389 Encounter for screening for other disorder: Secondary | ICD-10-CM | POA: Diagnosis not present

## 2016-09-13 DIAGNOSIS — Z1211 Encounter for screening for malignant neoplasm of colon: Secondary | ICD-10-CM | POA: Diagnosis not present

## 2016-09-13 DIAGNOSIS — Z299 Encounter for prophylactic measures, unspecified: Secondary | ICD-10-CM | POA: Diagnosis not present

## 2016-09-13 DIAGNOSIS — Z7189 Other specified counseling: Secondary | ICD-10-CM | POA: Diagnosis not present

## 2016-09-13 DIAGNOSIS — Z0000 Encounter for general adult medical examination without abnormal findings: Secondary | ICD-10-CM | POA: Diagnosis not present

## 2016-09-14 DIAGNOSIS — Z125 Encounter for screening for malignant neoplasm of prostate: Secondary | ICD-10-CM | POA: Diagnosis not present

## 2016-09-14 DIAGNOSIS — Z79899 Other long term (current) drug therapy: Secondary | ICD-10-CM | POA: Diagnosis not present

## 2016-09-14 DIAGNOSIS — R5383 Other fatigue: Secondary | ICD-10-CM | POA: Diagnosis not present

## 2016-11-01 DIAGNOSIS — H2513 Age-related nuclear cataract, bilateral: Secondary | ICD-10-CM | POA: Diagnosis not present

## 2016-12-16 DIAGNOSIS — I10 Essential (primary) hypertension: Secondary | ICD-10-CM | POA: Diagnosis not present

## 2017-01-11 ENCOUNTER — Other Ambulatory Visit: Payer: Self-pay | Admitting: Gastroenterology

## 2017-09-20 DIAGNOSIS — Z1339 Encounter for screening examination for other mental health and behavioral disorders: Secondary | ICD-10-CM | POA: Diagnosis not present

## 2017-09-20 DIAGNOSIS — I10 Essential (primary) hypertension: Secondary | ICD-10-CM | POA: Diagnosis not present

## 2017-09-20 DIAGNOSIS — Z0000 Encounter for general adult medical examination without abnormal findings: Secondary | ICD-10-CM | POA: Diagnosis not present

## 2017-09-20 DIAGNOSIS — Z1331 Encounter for screening for depression: Secondary | ICD-10-CM | POA: Diagnosis not present

## 2017-09-20 DIAGNOSIS — Z1211 Encounter for screening for malignant neoplasm of colon: Secondary | ICD-10-CM | POA: Diagnosis not present

## 2017-09-20 DIAGNOSIS — R5383 Other fatigue: Secondary | ICD-10-CM | POA: Diagnosis not present

## 2017-09-20 DIAGNOSIS — Z125 Encounter for screening for malignant neoplasm of prostate: Secondary | ICD-10-CM | POA: Diagnosis not present

## 2017-09-20 DIAGNOSIS — Z79899 Other long term (current) drug therapy: Secondary | ICD-10-CM | POA: Diagnosis not present

## 2017-09-20 DIAGNOSIS — N400 Enlarged prostate without lower urinary tract symptoms: Secondary | ICD-10-CM | POA: Diagnosis not present

## 2017-09-20 DIAGNOSIS — Z299 Encounter for prophylactic measures, unspecified: Secondary | ICD-10-CM | POA: Diagnosis not present

## 2017-09-20 DIAGNOSIS — Z7189 Other specified counseling: Secondary | ICD-10-CM | POA: Diagnosis not present

## 2017-09-20 DIAGNOSIS — Z6835 Body mass index (BMI) 35.0-35.9, adult: Secondary | ICD-10-CM | POA: Diagnosis not present

## 2017-09-21 DIAGNOSIS — Z125 Encounter for screening for malignant neoplasm of prostate: Secondary | ICD-10-CM | POA: Diagnosis not present

## 2017-09-21 DIAGNOSIS — R5383 Other fatigue: Secondary | ICD-10-CM | POA: Diagnosis not present

## 2017-09-21 DIAGNOSIS — Z79899 Other long term (current) drug therapy: Secondary | ICD-10-CM | POA: Diagnosis not present

## 2017-09-21 DIAGNOSIS — I10 Essential (primary) hypertension: Secondary | ICD-10-CM | POA: Diagnosis not present

## 2018-01-24 DIAGNOSIS — R079 Chest pain, unspecified: Secondary | ICD-10-CM | POA: Diagnosis not present

## 2018-01-24 DIAGNOSIS — Z713 Dietary counseling and surveillance: Secondary | ICD-10-CM | POA: Diagnosis not present

## 2018-01-24 DIAGNOSIS — I10 Essential (primary) hypertension: Secondary | ICD-10-CM | POA: Diagnosis not present

## 2018-01-24 DIAGNOSIS — Z6836 Body mass index (BMI) 36.0-36.9, adult: Secondary | ICD-10-CM | POA: Diagnosis not present

## 2018-01-24 DIAGNOSIS — Z299 Encounter for prophylactic measures, unspecified: Secondary | ICD-10-CM | POA: Diagnosis not present

## 2018-01-29 DIAGNOSIS — R079 Chest pain, unspecified: Secondary | ICD-10-CM | POA: Diagnosis not present

## 2018-02-15 ENCOUNTER — Other Ambulatory Visit: Payer: Self-pay

## 2018-02-15 ENCOUNTER — Ambulatory Visit (INDEPENDENT_AMBULATORY_CARE_PROVIDER_SITE_OTHER): Payer: Medicare Other | Admitting: Cardiology

## 2018-02-15 ENCOUNTER — Encounter: Payer: Self-pay | Admitting: *Deleted

## 2018-02-15 ENCOUNTER — Telehealth: Payer: Self-pay | Admitting: Cardiology

## 2018-02-15 ENCOUNTER — Encounter: Payer: Self-pay | Admitting: Cardiology

## 2018-02-15 VITALS — BP 135/82 | HR 53 | Ht 70.0 in | Wt 248.0 lb

## 2018-02-15 DIAGNOSIS — R079 Chest pain, unspecified: Secondary | ICD-10-CM

## 2018-02-15 NOTE — Progress Notes (Signed)
Clinical Summary Mr. Laura is a 67 y.o.male seen as new consult. Referred by Dr Woody Seller for chest pain.   1. Chest pain - started about 3 months ago. Nonspecific pain left sided, 5/10 in severity. Mainly with activity. No other associated symptoms. Better with position and deep breathing. Can last up to 15-20 minutes, one episode several hours.  - last episode 1 month.  - heavies activity is yard work which he tolerates, [place]  - 01/2018 echo at [facility] internal meidicine:  LVEF 70-75  CAD risk factors: HTN, +tobacco x 30 years.   Past Medical History:  Diagnosis Date  . BPH (benign prostatic hyperplasia)   . GERD (gastroesophageal reflux disease)   . HTN (hypertension)   . Kidney stone      Allergies  Allergen Reactions  . Other     Had reaction to prostate medication.     Current Outpatient Medications  Medication Sig Dispense Refill  . hydrocortisone (ANUSOL-HC) 25 MG suppository Place 1 suppository (25 mg total) rectally every 12 (twelve) hours. For 12 days 24 suppository 1  . lisinopril (PRINIVIL,ZESTRIL) 20 MG tablet Take 20 mg by mouth daily.    . NON FORMULARY New prostate med/ ? name    . omeprazole (PRILOSEC OTC) 20 MG tablet Take 20 mg by mouth daily.    Marland Kitchen PROCTOSOL HC 2.5 % rectal cream APPLY  CREAM RECTALLY TO AFFECTED AREA TWICE DAILY 28.35 g 1  . silodosin (RAPAFLO) 4 MG CAPS capsule Take 4 mg by mouth daily with breakfast.     No current facility-administered medications for this visit.      Past Surgical History:  Procedure Laterality Date  . COLONOSCOPY  05/2008   Dr. Anthony Sar, normal  . COLONOSCOPY N/A 09/25/2013   SLF: 1. the colon was redundant 2. One colon polyp removed  3.  Rectal bleeding due to large internal hemorrhoids  . ESOPHAGOGASTRODUODENOSCOPY     [place] per patient, unremarkable  . HEMORRHOID BANDING N/A 09/25/2013   Procedure: HEMORRHOID BANDING;  Surgeon: Danie Binder, MD;  [facility];  Service:  Endoscopy;  Laterality: N/A;  . HIATAL HERNIA REPAIR    . THYROID SURGERY       Allergies  Allergen Reactions  . Other     Had reaction to prostate medication.      Family History  Problem Relation Age of Onset  . Colon cancer Neg Hx      Social History Mr. Haugan reports that he has been smoking cigarettes.  He has been smoking about 1.00 pack per day. He does not have any smokeless tobacco history on file. Mr. Jankowski reports that he does not drink alcohol.   Review of Systems CONSTITUTIONAL: No weight loss, fever, chills, weakness or fatigue.  HEENT: Eyes: No visual loss, blurred vision, double vision or yellow sclerae.No hearing loss, sneezing, congestion, runny nose or sore throat.  SKIN: No rash or itching.  CARDIOVASCULAR: per hpi RESPIRATORY: No shortness of breath, cough or sputum.  GASTROINTESTINAL: No anorexia, nausea, vomiting or diarrhea. No abdominal pain or blood.  GENITOURINARY: No burning on urination, no polyuria NEUROLOGICAL: No headache, dizziness, syncope, paralysis, ataxia, numbness or tingling in the extremities. No change in bowel or bladder control.  MUSCULOSKELETAL: No muscle, back pain, joint pain or stiffness.  LYMPHATICS: No enlarged nodes. No history of splenectomy.  PSYCHIATRIC: No history of depression or anxiety.  ENDOCRINOLOGIC: No reports of sweating, cold or heat intolerance.  No polyuria or polydipsia.  Marland Kitchen   Physical Examination Vitals:   02/15/18 0911  BP: 135/82  Pulse: (!) 53  SpO2: 98%   Vitals:   02/15/18 0911  Weight: 248 lb (112.5 kg)  Height: 5\' 10"  (1.778 m)    Gen: resting comfortably, no acute distress HEENT: no scleral icterus, pupils equal round and reactive, no palptable cervical adenopathy,  CV: RRR, no m/r/g, no jvd Resp: Clear to auscultation bilaterally GI: abdomen is soft, non-tender, non-distended, normal bowel sounds, no hepatosplenomegaly MSK: extremities are warm, no edema.  Skin: warm, no  rash Neuro:  no focal deficits Psych: appropriate affect    Assessment and Plan  1. Chest pain - symptoms are somewhat mixed for possible cardiac etiology - EKG sinus brady, suggests prior inferior infarct however echo with normal LVEF and normal wall motion.  - multiple CAD risk factors. We will plan for a GXT to further evaluate for possible ischemia and risk stratify. Hold beta blocker day of test      Arnoldo Lenis, M.D.

## 2018-02-15 NOTE — Patient Instructions (Signed)
Your physician recommends that you schedule a follow-up appointment in: TO [place]  Your physician recommends that you continue on your current medications as directed. Please refer to the Current Medication list given to you today.  Your physician has requested that you have an exercise tolerance test. For further information please visit [email or web address]. Please also follow instruction sheet, as given.  Thank you for choosing [facility]!!

## 2018-02-15 NOTE — Telephone Encounter (Signed)
Pre-cert Verification for the following procedure   GXT scheduled for 02/19/2018  At [facility]

## 2018-02-19 ENCOUNTER — Ambulatory Visit (HOSPITAL_COMMUNITY)
Admission: RE | Admit: 2018-02-19 | Discharge: 2018-02-19 | Disposition: A | Discharge status: Home / Self Care | Payer: Medicare Other | Source: Ambulatory Visit | Attending: Cardiology | Admitting: Cardiology

## 2018-02-19 DIAGNOSIS — R079 Chest pain, unspecified: Secondary | ICD-10-CM | POA: Diagnosis not present

## 2018-02-19 LAB — EXERCISE TOLERANCE TEST
CHL CUP MPHR: 154 {beats}/min
CHL CUP RESTING HR STRESS: 63 {beats}/min
CHL RATE OF PERCEIVED EXERTION: 17
Estimated workload: 7 METS
Exercise duration (min): 6 min
Exercise duration (sec): 3 s
Peak HR: 162 {beats}/min
Percent HR: 105 %

## 2018-02-23 ENCOUNTER — Encounter: Payer: Self-pay | Admitting: *Deleted

## 2018-02-23 ENCOUNTER — Telehealth: Payer: Self-pay | Admitting: *Deleted

## 2018-02-23 DIAGNOSIS — R079 Chest pain, unspecified: Secondary | ICD-10-CM

## 2018-02-23 NOTE — Telephone Encounter (Signed)
Pt agreeable to lexiscan - will place orders and forward to schedulers.        Stress test showed some episodes of extra heart beats (PVCs) and a short run of an abnormal rhythm. In some situations this can be a sign of blockages in the arteries of the heart. We need to get some additional information given these findings and his symptoms. Please obtain an echocardiogram for chest pain, pending results I would anticipate obtaining a more detailed stress test with imaging to better clarify if any blockages exists  J BrancH MD

## 2018-02-23 NOTE — Telephone Encounter (Signed)
-----   Message from Arnoldo Lenis, MD sent at 02/22/2018  4:00 PM EDT ----- He is correct, it just got scanned in. He does not need a repeat but does need a lexiscan   J BrancH MD ----- Message ----- From: Massie Maroon, CMA Sent: 02/21/2018   4:59 PM To: Arnoldo Lenis, MD  This pt had an echo from [place] about 3 weeks ago and wants to know if another one is necessary (echo in [place])

## 2018-02-27 ENCOUNTER — Telehealth: Payer: Self-pay | Admitting: Cardiology

## 2018-02-27 NOTE — Telephone Encounter (Signed)
Pre-cert Verification for the following procedure   lexiscan myoview scheduled for 03-05-2018 at [facility]

## 2018-03-05 ENCOUNTER — Encounter (HOSPITAL_BASED_OUTPATIENT_CLINIC_OR_DEPARTMENT_OTHER)
Admission: RE | Admit: 2018-03-05 | Discharge: 2018-03-05 | Disposition: A | Discharge status: Home / Self Care | Payer: Medicare Other | Source: Ambulatory Visit | Attending: Cardiology | Admitting: Cardiology

## 2018-03-05 ENCOUNTER — Encounter (HOSPITAL_COMMUNITY): Payer: Self-pay

## 2018-03-05 ENCOUNTER — Encounter (HOSPITAL_COMMUNITY)
Admission: RE | Admit: 2018-03-05 | Discharge: 2018-03-05 | Disposition: A | Discharge status: Home / Self Care | Payer: Medicare Other | Source: Ambulatory Visit | Attending: Cardiology | Admitting: Cardiology

## 2018-03-05 DIAGNOSIS — R079 Chest pain, unspecified: Secondary | ICD-10-CM | POA: Diagnosis not present

## 2018-03-05 LAB — NM MYOCAR MULTI W/SPECT W/WALL MOTION / EF
CHL CUP RESTING HR STRESS: 48 {beats}/min
LV dias vol: 147 mL (ref 62–150)
LVSYSVOL: 74 mL
Peak HR: 77 {beats}/min
RATE: 0.36
SDS: 1
SRS: 1
SSS: 2
TID: 1.1

## 2018-03-05 MED ORDER — TECHNETIUM TC 99M TETROFOSMIN IV KIT
30.0000 | PACK | Freq: Once | INTRAVENOUS | Status: AC | PRN
  Administered 2018-03-05: 31 via INTRAVENOUS

## 2018-03-05 MED ORDER — TECHNETIUM TC 99M TETROFOSMIN IV KIT
10.0000 | PACK | Freq: Once | INTRAVENOUS | Status: AC | PRN
  Administered 2018-03-05: 10.7 via INTRAVENOUS

## 2018-03-05 MED ORDER — REGADENOSON 0.4 MG/5ML IV SOLN
INTRAVENOUS | Status: AC
  Administered 2018-03-05: 0.4 mg via INTRAVENOUS
  Filled 2018-03-05: qty 5

## 2018-03-05 MED ORDER — SODIUM CHLORIDE 0.9% FLUSH
INTRAVENOUS | Status: AC
  Administered 2018-03-05: 10 mL via INTRAVENOUS
  Filled 2018-03-05: qty 10

## 2018-03-06 DIAGNOSIS — K219 Gastro-esophageal reflux disease without esophagitis: Secondary | ICD-10-CM | POA: Diagnosis not present

## 2018-03-06 DIAGNOSIS — I10 Essential (primary) hypertension: Secondary | ICD-10-CM | POA: Diagnosis not present

## 2018-03-06 DIAGNOSIS — Z713 Dietary counseling and surveillance: Secondary | ICD-10-CM | POA: Diagnosis not present

## 2018-03-06 DIAGNOSIS — Z299 Encounter for prophylactic measures, unspecified: Secondary | ICD-10-CM | POA: Diagnosis not present

## 2018-03-09 ENCOUNTER — Telehealth: Payer: Self-pay | Admitting: *Deleted

## 2018-03-09 NOTE — Telephone Encounter (Signed)
Pt aware and voiced understanding - says symptoms have remained the same. Dr Woody Seller gave pt Protonix this week for reflux. 6 week f/u scheduled - routed to pcp

## 2018-03-09 NOTE — Telephone Encounter (Signed)
-----   Message from Drema Dallas, [place] sent at 03/09/2018 11:19 AM EDT -----   ----- Message ----- From: Arnoldo Lenis, MD Sent: 03/08/2018   2:05 PM To: Drema Dallas, CMA  Stress test overall looks good, there is a small questionable area that may represent a very small blockage or possibly just normal shadowing. There is no evidence of large blockages, overall the test is low risk. How are his symptoms doing? He should f/u with me in 6 weeks  Zandra Abts MD

## 2018-05-07 ENCOUNTER — Encounter: Payer: Self-pay | Admitting: Cardiology

## 2018-05-07 ENCOUNTER — Ambulatory Visit (INDEPENDENT_AMBULATORY_CARE_PROVIDER_SITE_OTHER): Payer: Medicare Other | Admitting: Cardiology

## 2018-05-07 ENCOUNTER — Other Ambulatory Visit: Payer: Self-pay

## 2018-05-07 VITALS — BP 138/75 | HR 67 | Ht 70.0 in | Wt 251.0 lb

## 2018-05-07 DIAGNOSIS — Z6836 Body mass index (BMI) 36.0-36.9, adult: Secondary | ICD-10-CM | POA: Diagnosis not present

## 2018-05-07 DIAGNOSIS — I493 Ventricular premature depolarization: Secondary | ICD-10-CM

## 2018-05-07 DIAGNOSIS — R0789 Other chest pain: Secondary | ICD-10-CM | POA: Diagnosis not present

## 2018-05-07 DIAGNOSIS — Z299 Encounter for prophylactic measures, unspecified: Secondary | ICD-10-CM | POA: Diagnosis not present

## 2018-05-07 DIAGNOSIS — J069 Acute upper respiratory infection, unspecified: Secondary | ICD-10-CM | POA: Diagnosis not present

## 2018-05-07 DIAGNOSIS — K921 Melena: Secondary | ICD-10-CM | POA: Diagnosis not present

## 2018-05-07 DIAGNOSIS — I10 Essential (primary) hypertension: Secondary | ICD-10-CM | POA: Diagnosis not present

## 2018-05-07 DIAGNOSIS — F17210 Nicotine dependence, cigarettes, uncomplicated: Secondary | ICD-10-CM | POA: Diagnosis not present

## 2018-05-07 NOTE — Progress Notes (Signed)
Clinical Summary [name] is a 67 y.o.male seen today for follow up of the following medical problems.   1. Chest pain - 01/2018 echo at [facility] internal meidicine:  LVEF 70-75 01/2018 GXT: no ST changes. Frequent PVCs, short runs of NSVT longest 11 beats 02/2018 nuclear stress: small mild inferior defect partially reversible. Significant artifact in the area. Possibly mild ischemia vs artifact.   - symptmoms resolved with protonix.  - no recent palpitations   Past Medical History:  Diagnosis Date  . BPH (benign prostatic hyperplasia)   . GERD (gastroesophageal reflux disease)   . HTN (hypertension)   . Kidney stone      Allergies  Allergen Reactions  . Other     Had reaction to prostate medication.     Current Outpatient Medications  Medication Sig Dispense Refill  . amLODipine (NORVASC) 5 MG tablet 5 mg daily.    Marland Kitchen aspirin EC 81 MG tablet Take 81 mg by mouth daily.    . famotidine (PEPCID) 20 MG tablet Take 20 mg by mouth 2 (two) times daily.    . metoprolol succinate (TOPROL-XL) 100 MG 24 hr tablet 100 mg daily.    . tamsulosin (FLOMAX) 0.4 MG CAPS capsule 0.4 mg daily.     No current facility-administered medications for this visit.      Past Surgical History:  Procedure Laterality Date  . COLONOSCOPY  05/2008   Dr. Anthony Hamilton, normal  . COLONOSCOPY N/A 09/25/2013   SLF: 1. the colon was redundant 2. One colon polyp removed  3.  Rectal bleeding due to large internal hemorrhoids  . ESOPHAGOGASTRODUODENOSCOPY     [place] per patient, unremarkable  . HEMORRHOID BANDING N/A 09/25/2013   Procedure: HEMORRHOID BANDING;  Surgeon: Stephen Binder, MD;  [facility];  Service: Endoscopy;  Laterality: N/A;  . HIATAL HERNIA REPAIR    . THYROID SURGERY       Allergies  Allergen Reactions  . Other     Had reaction to prostate medication.      Family History  Problem Relation Age of Onset  . Colon cancer Neg Hx      Social History [name] reports  that he has been smoking cigarettes.  He has been smoking about 0.75 packs per day. He has never used smokeless tobacco. [name] reports that he does not drink alcohol.   Review of Systems CONSTITUTIONAL: No weight loss, fever, chills, weakness or fatigue.  HEENT: Eyes: No visual loss, blurred vision, double vision or yellow sclerae.No hearing loss, sneezing, congestion, runny nose or sore throat.  SKIN: No rash or itching.  CARDIOVASCULAR: per hpi RESPIRATORY: No shortness of breath, cough or sputum.  GASTROINTESTINAL: No anorexia, nausea, vomiting or diarrhea. No abdominal pain or blood.  GENITOURINARY: No burning on urination, no polyuria NEUROLOGICAL: No headache, dizziness, syncope, paralysis, ataxia, numbness or tingling in the extremities. No change in bowel or bladder control.  MUSCULOSKELETAL: No muscle, back pain, joint pain or stiffness.  LYMPHATICS: No enlarged nodes. No history of splenectomy.  PSYCHIATRIC: No history of depression or anxiety.  ENDOCRINOLOGIC: No reports of sweating, cold or heat intolerance. No polyuria or polydipsia.  Marland Kitchen   Physical Examination Vitals:   05/07/18 1458  BP: 138/75  Pulse: 67  SpO2: 99%   Vitals:   05/07/18 1458  Weight: 251 lb (113.9 kg)  Height: 5\' 10"  (1.778 m)    Gen: resting comfortably, no acute distress HEENT: no scleral icterus, pupils equal  round and reactive, no palptable cervical adenopathy,  CV: RRR, no m/r/g, no jvd Resp: Clear to auscultation bilaterally GI: abdomen is soft, non-tender, non-distended, normal bowel sounds, no hepatosplenomegaly MSK: extremities are warm, no edema.  Skin: warm, no rash Neuro:  no focal deficits Psych: appropriate affect   Diagnostic Studies  - 01/2018 echo at [facility] internal meidicine:  LVEF 70-75  01/2018 GXT   Blood pressure demonstrated a hypertensive response to exercise.  There was no ST segment deviation noted during stress.  Frequent PVCs during exercise with short  3-4 beat runs of NSVT. 5 seconds into recovery 11 beat run of NSVT  No ischemic ST/T changes. Exercised induced PVCs and 11 beat run of NSVT. Consider stress imagning to further evaluate ischemia as the possible cause.  02/2018 nuclear stress  No diagnostic ST segment changes to indicate ischemia.  Small, mild intensity, inferior defect that is partially reversible in the mid to apical zone, otherwise fixed. There is radiotracer uptake within the gut near the inferior wall particularly on rest imaging. This and the presence of diaphragmatic attenuation reduces specificity, however a mild region of mid to apical inferior ischemia cannot be excluded.  This is a low risk study.  Nuclear stress EF: 50%.   Assessment and Plan   1. Chest pain - low risk stress test recently, small mild ischemia vs artifact. Symptoms have resolved with PPI - continue to monitor at this time  2. PVCs/NSVT - noted during GXT, PVCs and 11 beat run of NSVT - echo with normal LV function, no significant ischemia on recent nuclear stress. Overall no significant evidence of underlying heart disease.  - no symptoms, continue beta blocker.    F/u 6 months     Arnoldo Lenis, M.D.

## 2018-05-07 NOTE — Patient Instructions (Signed)
Your physician wants you to follow-up in: [place] will receive a reminder letter in the mail two months in advance. If you don't receive a letter, please call our office to schedule the follow-up appointment.  Your physician has recommended you make the following change in your medication:   [place]   Thank you for choosing [facility]!!

## 2018-05-08 ENCOUNTER — Encounter: Payer: Self-pay | Admitting: Gastroenterology

## 2018-05-10 ENCOUNTER — Encounter: Payer: Self-pay | Admitting: Cardiology

## 2018-05-10 ENCOUNTER — Encounter: Payer: Self-pay | Admitting: *Deleted

## 2018-05-23 IMAGING — NM NM MYOCAR MULTI W/SPECT W/WALL MOTION & EF
2 series · 12 of 12 positions shown · non-contrast
Comparison: none

[Series 1: rest · 6.51mm/px · 6 of 64 frames shown]
[frame 6/64]
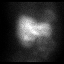
[frame 16/64]
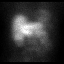
[frame 27/64]
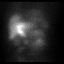
[frame 38/64]
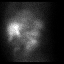
[frame 48/64]
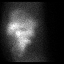
[frame 59/64]
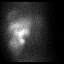

[Series 3: stress gated - perfusion · 6.51mm/px · 6 of 64 frames shown]
[frame 6/64]
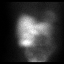
[frame 16/64]
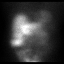
[frame 27/64]
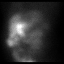
[frame 38/64]
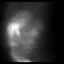
[frame 48/64]
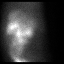
[frame 59/64]
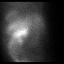

[12 of 12 positions shown; findings below may reference images not displayed]

Canned report from images found in remote index.

Refer to host system for actual result text.

## 2018-07-12 ENCOUNTER — Ambulatory Visit: Payer: Medicare Other | Admitting: Gastroenterology

## 2018-09-12 ENCOUNTER — Ambulatory Visit (INDEPENDENT_AMBULATORY_CARE_PROVIDER_SITE_OTHER): Payer: Medicare Other | Admitting: Gastroenterology

## 2018-09-12 ENCOUNTER — Encounter: Payer: Self-pay | Admitting: Gastroenterology

## 2018-09-12 DIAGNOSIS — K5900 Constipation, unspecified: Secondary | ICD-10-CM | POA: Insufficient documentation

## 2018-09-12 DIAGNOSIS — K5901 Slow transit constipation: Secondary | ICD-10-CM | POA: Diagnosis not present

## 2018-09-12 DIAGNOSIS — K648 Other hemorrhoids: Secondary | ICD-10-CM

## 2018-09-12 MED ORDER — LIDOCAINE-HYDROCORTISONE ACE 3-2.5 % RE KIT: PACK | RECTAL | 0 refills | Status: DC

## 2018-09-12 NOTE — Progress Notes (Signed)
   Subjective:    Patient ID: Stephen Hamilton, male    DOB: 03-08-1951, 67 y.o.   MRN: [id number]  Glenda Chroman, MD   HPI BRBPR: GOES AND COMES. A WEEK AGO SAW A RED SPOT. ONE MONTH AGO FELT DRIPPING BUT IT'S NOT OFTEN. WHEN WIPES: 3X/MO OR MORE. CAN HAVE RECTAL PRESSURE, NO RECTAL PAIN, ITCHING, BURNING, OR SOILING. BANDING HELPED 5 YRS.  AT THIS POINT BANDING: SUMMER WORSE IN YEARS. WILL RARELY HAVE CONSTIPATION AND THAT'S WHEN HE SEES HEAVY BLEEDING.   PT DENIES FEVER, CHILLS, HEMATEMESIS, nausea, vomiting, melena, diarrhea, CHEST PAIN, SHORTNESS OF BREATH, CHANGE IN BOWEL IN HABITS, abdominal pain, problems swallowing, problems with sedation, OR  heartburn or indigestion.  Past Medical History:  Diagnosis Date  . BPH (benign prostatic hyperplasia)   . GERD (gastroesophageal reflux disease)   . HTN (hypertension)   . Kidney stone    Past Surgical History:  Procedure Laterality Date  . COLONOSCOPY  05/2008   Dr. Anthony Sar, normal  . COLONOSCOPY N/A 09/25/2013   [facility]F: 1. the colon was redundant 2. One colon polyp removed  3.  Rectal bleeding due to large internal hemorrhoids  . ESOPHAGOGASTRODUODENOSCOPY     [facility] per patient, unremarkable  . HEMORRHOID BANDING N/A 09/25/2013   Procedure: HEMORRHOID BANDING;  Surgeon: Danie Binder, MD;  [facility];  Service: Endoscopy;  Laterality: N/A;  . HIATAL HERNIA REPAIR    . THYROID SURGERY     Allergies  Allergen Reactions  . Other     Had reaction to prostate medication.   Current Outpatient Medications  Medication Sig Dispense Refill  . amLODipine (NORVASC) 5 MG tablet 5 mg daily.    . metoprolol succinate (TOPROL-XL) 100 MG 24 hr tablet 100 mg daily.    . pantoprazole (PROTONIX) 40 MG tablet Take 40 mg by mouth daily.  3  . tamsulosin (FLOMAX) 0.4 MG CAPS capsule 0.4 mg daily.    .       Review of Systems PER HPI OTHERWISE ALL SYSTEMS ARE NEGATIVE.     Objective:   Physical Exam  Constitutional: He is oriented  to person, place, and time. He appears well-developed and well-nourished. No distress.  HENT:  Head: Normocephalic and atraumatic.  Mouth/Throat: Oropharynx is clear and moist. No oropharyngeal exudate.  Eyes: Pupils are equal, round, and reactive to light. No scleral icterus.  Neck: Normal range of motion. Neck supple.  Cardiovascular: Normal rate, regular rhythm and normal heart sounds.  Pulmonary/Chest: Effort normal and breath sounds normal. No respiratory distress.  Abdominal: Soft. Bowel sounds are normal. He exhibits no distension. There is no tenderness.  Musculoskeletal: He exhibits no edema.  Lymphadenopathy:    He has no cervical adenopathy.  Neurological: He is alert and oriented to person, place, and time.  NO  NEW FOCAL DEFICITS  Psychiatric: He has a normal mood and affect.  Vitals reviewed.     Assessment & Plan:

## 2018-09-12 NOTE — Patient Instructions (Addendum)
DRINK WATER TO KEEP YOUR URINE LIGHT YELLOW.  FOLLOW A HIGH FIBER DIET. AVOID ITEMS THAT CAUSE BLOATING & GAS. SEE INFO BELOW.    USE APOTHECARY CREAM FOUR TIMES  A DAY FOR 2 WEEKS IF NEEDED TO RELIEVE RECTAL PAIN/PRESSURE/BLEEDING.  USE LINZESS TO PREVENT CONSTIPATION. IT MAY CAUSE EXPLOSIVE DIARRHEA.    LET ME KNOW WHEN ARE READY FOR HEMORRHOID BANDING.  FOLLOW UP AS NEEDED.   PLEASE FEEL FREE TO CALL WITH QUESTIONS OR CONCERNS.  High-Fiber Diet A high-fiber diet changes your normal diet to include more whole grains, legumes, fruits, and vegetables. Changes in the diet involve replacing refined carbohydrates with unrefined foods. The calorie level of the diet is essentially unchanged. The Dietary Reference Intake (recommended amount) for adult males is 38 grams per day. For adult females, it is 25 grams per day. Pregnant and lactating women should consume 28 grams of fiber per day. Fiber is the intact part of a plant that is not broken down during digestion. Functional fiber is fiber that has been isolated from the plant to provide a beneficial effect in the body.  PURPOSE  Increase stool bulk.   Ease and regulate bowel movements.   Lower cholesterol.   REDUCE RISK OF COLON CANCER  INDICATIONS THAT YOU NEED MORE FIBER  Constipation and hemorrhoids.   Uncomplicated diverticulosis (intestine condition) and irritable bowel syndrome.   Weight management.   As a protective measure against hardening of the arteries (atherosclerosis), diabetes, and cancer.   GUIDELINES FOR INCREASING FIBER IN THE DIET  Start adding fiber to the diet slowly. A gradual increase of about 5 more grams (2 slices of whole-wheat bread, 2 servings of most fruits or vegetables, or 1 bowl of high-fiber cereal) per day is best. Too rapid an increase in fiber may result in constipation, flatulence, and bloating.   Drink enough water and fluids to keep your urine clear or pale yellow. Water, juice, or  caffeine-free drinks are recommended. Not drinking enough fluid may cause constipation.   Eat a variety of high-fiber foods rather than one type of fiber.   Try to increase your intake of fiber through using high-fiber foods rather than fiber pills or supplements that contain small amounts of fiber.   The goal is to change the types of food eaten. Do not supplement your present diet with high-fiber foods, but replace foods in your present diet.  INCLUDE A VARIETY OF FIBER SOURCES  Replace refined and processed grains with whole grains, canned fruits with fresh fruits, and incorporate other fiber sources. White rice, white breads, and most bakery goods contain little or no fiber.   Brown whole-grain rice, buckwheat oats, and many fruits and vegetables are all good sources of fiber. These include: broccoli, Brussels sprouts, cabbage, cauliflower, beets, sweet potatoes, white potatoes (skin on), carrots, tomatoes, eggplant, squash, berries, fresh fruits, and dried fruits.   Cereals appear to be the richest source of fiber. Cereal fiber is found in whole grains and bran. Bran is the fiber-rich outer coat of cereal grain, which is largely removed in refining. In whole-grain cereals, the bran remains. In breakfast cereals, the largest amount of fiber is found in those with "bran" in their names. The fiber content is sometimes indicated on the label.   You may need to include additional fruits and vegetables each day.   In baking, for 1 cup white flour, you may use the following substitutions:   1 cup whole-wheat flour minus 2 tablespoons.   1/2  cup white flour plus 1/2 cup whole-wheat flour.

## 2018-09-12 NOTE — Assessment & Plan Note (Signed)
SYMPTOMS NOT IDEALLY CONTROLLED AND EXACERBATED BY CONSTIPATION.  DRINK WATER TO KEEP YOUR URINE LIGHT YELLOW. FOLLOW A HIGH FIBER DIET. AVOID ITEMS THAT CAUSE BLOATING & GAS.  HANDOUT GIVEN. USE APOTHECARY CREAM FOUR TIMES  A DAY FOR 2 WEEKS IF NEEDED TO RELIEVE RECTAL PAIN/PRESSURE/BLEEDING. USE LINZESS TO PREVENT CONSTIPATION. IT MAY CAUSE EXPLOSIVE DIARRHEA. LET ME KNOW WHEN ARE READY FOR HEMORRHOID BANDING. FOLLOW UP AS NEEDED.  PLEASE FEEL FREE TO CALL WITH QUESTIONS OR CONCERNS.

## 2018-09-12 NOTE — Assessment & Plan Note (Signed)
SYMPTOMS NOT IDEALLY CONTROLLED.  DRINK WATER TO KEEP YOUR URINE LIGHT YELLOW. FOLLOW A HIGH FIBER DIET. AVOID ITEMS THAT CAUSE BLOATING & GAS.  HANDOUT GIVEN. USE LINZESS TO PREVENT CONSTIPATION. IT MAY CAUSE EXPLOSIVE DIARRHEA. LET ME KNOW WHEN ARE READY FOR HEMORRHOID BANDING. FOLLOW UP AS NEEDED.  PLEASE FEEL FREE TO CALL WITH QUESTIONS OR CONCERNS.

## 2018-09-13 NOTE — Progress Notes (Signed)
cc'ed to pcp °

## 2018-09-21 DIAGNOSIS — F17210 Nicotine dependence, cigarettes, uncomplicated: Secondary | ICD-10-CM | POA: Diagnosis not present

## 2018-09-21 DIAGNOSIS — Z6836 Body mass index (BMI) 36.0-36.9, adult: Secondary | ICD-10-CM | POA: Diagnosis not present

## 2018-09-21 DIAGNOSIS — Z0000 Encounter for general adult medical examination without abnormal findings: Secondary | ICD-10-CM | POA: Diagnosis not present

## 2018-09-21 DIAGNOSIS — Z1211 Encounter for screening for malignant neoplasm of colon: Secondary | ICD-10-CM | POA: Diagnosis not present

## 2018-09-21 DIAGNOSIS — I10 Essential (primary) hypertension: Secondary | ICD-10-CM | POA: Diagnosis not present

## 2018-09-21 DIAGNOSIS — R5383 Other fatigue: Secondary | ICD-10-CM | POA: Diagnosis not present

## 2018-09-21 DIAGNOSIS — Z299 Encounter for prophylactic measures, unspecified: Secondary | ICD-10-CM | POA: Diagnosis not present

## 2018-09-21 DIAGNOSIS — Z7189 Other specified counseling: Secondary | ICD-10-CM | POA: Diagnosis not present

## 2018-09-21 DIAGNOSIS — Z1331 Encounter for screening for depression: Secondary | ICD-10-CM | POA: Diagnosis not present

## 2018-09-21 DIAGNOSIS — Z1339 Encounter for screening examination for other mental health and behavioral disorders: Secondary | ICD-10-CM | POA: Diagnosis not present

## 2018-10-09 DIAGNOSIS — I10 Essential (primary) hypertension: Secondary | ICD-10-CM | POA: Diagnosis not present

## 2018-10-09 DIAGNOSIS — Z125 Encounter for screening for malignant neoplasm of prostate: Secondary | ICD-10-CM | POA: Diagnosis not present

## 2018-10-09 DIAGNOSIS — R5383 Other fatigue: Secondary | ICD-10-CM | POA: Diagnosis not present

## 2018-10-09 DIAGNOSIS — Z79899 Other long term (current) drug therapy: Secondary | ICD-10-CM | POA: Diagnosis not present

## 2019-05-06 ENCOUNTER — Ambulatory Visit: Payer: Medicare Other | Admitting: Cardiology

## 2019-07-31 ENCOUNTER — Encounter: Payer: Self-pay | Admitting: Cardiology

## 2019-07-31 ENCOUNTER — Ambulatory Visit (INDEPENDENT_AMBULATORY_CARE_PROVIDER_SITE_OTHER): Payer: Medicare Other | Admitting: Cardiology

## 2019-07-31 ENCOUNTER — Other Ambulatory Visit: Payer: Self-pay

## 2019-07-31 VITALS — BP 134/74 | HR 60 | Ht 70.0 in | Wt 261.2 lb

## 2019-07-31 DIAGNOSIS — R0789 Other chest pain: Secondary | ICD-10-CM | POA: Diagnosis not present

## 2019-07-31 DIAGNOSIS — I10 Essential (primary) hypertension: Secondary | ICD-10-CM | POA: Diagnosis not present

## 2019-07-31 DIAGNOSIS — I493 Ventricular premature depolarization: Secondary | ICD-10-CM | POA: Diagnosis not present

## 2019-07-31 NOTE — Progress Notes (Signed)
Clinical Summary Stephen Hamilton is a 68 y.o.male seen today for follow up of the following medical problems.   1. Chest pain - 01/2018 echoat [facility]:LVEF 70-75 01/2018 GXT: no ST changes. Frequent PVCs, short runs of NSVT longest 11 beats  02/2018 nuclear stress: small mild inferior defect partially reversible. Significant artifact in the area. Possibly mild ischemia vs artifact.   - symptmoms resolved with protonix.    - no recent symptoms. Continues to do well    2. HTN - compliant with meds.    3. 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  Past Medical History:  Diagnosis Date  . BPH (benign prostatic hyperplasia)   . GERD (gastroesophageal reflux disease)   . HTN (hypertension)   . Kidney stone      Allergies  Allergen Reactions  . Other     Had reaction to prostate medication.     Current Outpatient Medications  Medication Sig Dispense Refill  . amLODipine (NORVASC) 5 MG tablet 5 mg daily.    . Lidocaine-Hydrocortisone Ace 3-2.5 % KIT APPLY TO RECTUM QID FOR 2 WEEKS 1 each 0  . metoprolol succinate (TOPROL-XL) 100 MG 24 hr tablet 100 mg daily.    . pantoprazole (PROTONIX) 40 MG tablet Take 40 mg by mouth daily.  3  . tamsulosin (FLOMAX) 0.4 MG CAPS capsule 0.4 mg daily.     No current facility-administered medications for this visit.      Past Surgical History:  Procedure Laterality Date  . COLONOSCOPY  05/2008   Dr. Anthony Sar, normal  . COLONOSCOPY N/A 09/25/2013   SLF: 1. the colon was redundant 2. One colon polyp removed  3.  Rectal bleeding due to large internal hemorrhoids  . ESOPHAGOGASTRODUODENOSCOPY     [place] per patient, unremarkable  . HEMORRHOID BANDING N/A 09/25/2013   Procedure: HEMORRHOID BANDING;  Surgeon: Danie Binder, MD;  [facility];  Service: Endoscopy;  Laterality: N/A;  . HIATAL HERNIA REPAIR    . THYROID SURGERY       Allergies   Allergen Reactions  . Other     Had reaction to prostate medication.      Family History  Problem Relation Age of Onset  . Colon cancer Neg Hx      Social History Stephen Hamilton reports that he has been smoking cigarettes. He has been smoking about 1.00 pack per day. He has never used smokeless tobacco. Stephen Hamilton reports no history of alcohol use.   Review of Systems CONSTITUTIONAL: No weight loss, fever, chills, weakness or fatigue.  HEENT: Eyes: No visual loss, blurred vision, double vision or yellow sclerae.No hearing loss, sneezing, congestion, runny nose or sore throat.  SKIN: No rash or itching.  CARDIOVASCULAR: per hpi RESPIRATORY: No shortness of breath, cough or sputum.  GASTROINTESTINAL: No anorexia, nausea, vomiting or diarrhea. No abdominal pain or blood.  GENITOURINARY: No burning on urination, no polyuria NEUROLOGICAL: No headache, dizziness, syncope, paralysis, ataxia, numbness or tingling in the extremities. No change in bowel or bladder control.  MUSCULOSKELETAL: No muscle, back pain, joint pain or stiffness.  LYMPHATICS: No enlarged nodes. No history of splenectomy.  PSYCHIATRIC: No history of depression or anxiety.  ENDOCRINOLOGIC: No reports of sweating, cold or heat intolerance. No polyuria or polydipsia.  Marland Kitchen   Physical Examination Today's Vitals   07/31/19 1107  BP: 134/74  Pulse: 60  SpO2: 96%  Weight: 261 lb  3.2 oz (118.5 kg)  Height: _0  (1.778 m)   Body mass index is 37.48 kg/m.  Gen: resting comfortably, no acute distress HEENT: no scleral icterus, pupils equal round and reactive, no palptable cervical adenopathy,  CV: RRR, no m/r/g, no jvd Resp: Clear to auscultation bilaterally GI: abdomen is soft, non-tender, non-distended, normal bowel sounds, no hepatosplenomegaly MSK: extremities are warm, no edema.  Skin: warm, no rash Neuro:  no focal deficits Psych: appropriate affect   Diagnostic Studies  01/2018 echoat [facility]  meidicine:LVEF 70-75  01/2018 GXT   Blood pressure demonstrated a hypertensive response to exercise.  There was no ST segment deviation noted during stress.  Frequent PVCs during exercise with short 3-4 beat runs of NSVT. 5 seconds into recovery 11 beat run of NSVT  No ischemic ST/T changes. Exercised induced PVCs and 11 beat run of NSVT. Consider stress imagning to further evaluate ischemia as the possible cause.  02/2018 nuclear stress  No diagnostic ST segment changes to indicate ischemia.  Small, mild intensity, inferior defect that is partially reversible in the mid to apical zone, otherwise fixed. There is radiotracer uptake within the gut near the inferior wall particularly on rest imaging. This and the presence of diaphragmatic attenuation reduces specificity, however a mild region of mid to apical inferior ischemia cannot be excluded.  This is a low risk study. Nuclear stress EF: 50%.     Assessment and Plan   1. Chest pain - low risk stress test recently, small mild ischemia vs artifact. Symptoms have resolved with PPI - continues to do well without recurent symptoms, no further workup at this time.   2. PVCs/NSVT - noted during GXT, PVCs and 11 beat run of NSVT - echo with normal LV function, no significant ischemia on recent nuclear stress. Overall no significant evidence of underlying heart disease.  - denies any palpitations, no further workup at this time - EKG today shows NSR  3. HTN - essentially at goal, continue current meds   F/u as needed     Arnoldo Lenis, M.D.

## 2019-07-31 NOTE — Patient Instructions (Signed)
Medication Instructions:  Continue all current medications.  Labwork: none  Testing/Procedures: none  Follow-Up: As needed.    Any Other Special Instructions Will Be Listed Below (If Applicable).  If you need a refill on your cardiac medications before your next appointment, please call your pharmacy.  

## 2019-10-03 DIAGNOSIS — F17210 Nicotine dependence, cigarettes, uncomplicated: Secondary | ICD-10-CM | POA: Diagnosis not present

## 2019-10-03 DIAGNOSIS — Z1339 Encounter for screening examination for other mental health and behavioral disorders: Secondary | ICD-10-CM | POA: Diagnosis not present

## 2019-10-03 DIAGNOSIS — Z299 Encounter for prophylactic measures, unspecified: Secondary | ICD-10-CM | POA: Diagnosis not present

## 2019-10-03 DIAGNOSIS — Z1211 Encounter for screening for malignant neoplasm of colon: Secondary | ICD-10-CM | POA: Diagnosis not present

## 2019-10-03 DIAGNOSIS — R5383 Other fatigue: Secondary | ICD-10-CM | POA: Diagnosis not present

## 2019-10-03 DIAGNOSIS — Z6838 Body mass index (BMI) 38.0-38.9, adult: Secondary | ICD-10-CM | POA: Diagnosis not present

## 2019-10-03 DIAGNOSIS — Z23 Encounter for immunization: Secondary | ICD-10-CM | POA: Diagnosis not present

## 2019-10-03 DIAGNOSIS — Z1331 Encounter for screening for depression: Secondary | ICD-10-CM | POA: Diagnosis not present

## 2019-10-03 DIAGNOSIS — Z125 Encounter for screening for malignant neoplasm of prostate: Secondary | ICD-10-CM | POA: Diagnosis not present

## 2019-10-03 DIAGNOSIS — Z0000 Encounter for general adult medical examination without abnormal findings: Secondary | ICD-10-CM | POA: Diagnosis not present

## 2019-10-03 DIAGNOSIS — Z79899 Other long term (current) drug therapy: Secondary | ICD-10-CM | POA: Diagnosis not present

## 2019-10-03 DIAGNOSIS — I10 Essential (primary) hypertension: Secondary | ICD-10-CM | POA: Diagnosis not present

## 2019-10-03 DIAGNOSIS — Z7189 Other specified counseling: Secondary | ICD-10-CM | POA: Diagnosis not present

## 2019-11-25 ENCOUNTER — Other Ambulatory Visit: Payer: Self-pay

## 2019-11-25 ENCOUNTER — Ambulatory Visit: Payer: Medicare Other | Attending: Internal Medicine

## 2019-11-25 DIAGNOSIS — Z20822 Contact with and (suspected) exposure to covid-19: Secondary | ICD-10-CM

## 2019-11-26 LAB — NOVEL CORONAVIRUS, NAA: SARS-CoV-2, NAA: NOT DETECTED

## 2020-01-04 ENCOUNTER — Ambulatory Visit: Payer: Medicare HMO | Attending: Internal Medicine

## 2020-01-04 DIAGNOSIS — Z23 Encounter for immunization: Secondary | ICD-10-CM

## 2020-01-04 NOTE — Progress Notes (Signed)
   Covid-19 Vaccination Clinic  Name:  Sye Auer    MRN: [id number] DOB: August 26, 1951  01/04/2020  Mr. Colas was observed post Covid-19 immunization for 15 minutes without incidence. He was provided with Vaccine Information Sheet and instruction to access the V-Safe system.   Mr. Hallmark was instructed to call 911 with any severe reactions post vaccine: Marland Kitchen Difficulty breathing  . Swelling of your face and throat  . A fast heartbeat  . A bad rash all over your body  . Dizziness and weakness    Immunizations Administered    Name Date Dose VIS Date Route   Moderna COVID-19 Vaccine 01/04/2020 12:59 PM 0.5 mL 10/29/2019 Intramuscular   Manufacturer: Moderna   Lot: [id number]   [id number]

## 2020-01-28 ENCOUNTER — Ambulatory Visit: Payer: Medicare HMO | Attending: Internal Medicine

## 2020-01-28 DIAGNOSIS — Z23 Encounter for immunization: Secondary | ICD-10-CM | POA: Insufficient documentation

## 2020-01-28 NOTE — Progress Notes (Signed)
   Covid-19 Vaccination Clinic  Name:  Stephen Hamilton    MRN: [id number] DOB: [birth year]  01/28/2020  [name] was observed post Covid-19 immunization for 15 minutes without incident. He was provided with Vaccine Information Sheet and instruction to access the V-Safe system.   [name] was instructed to call 911 with any severe reactions post vaccine: Marland Kitchen Difficulty breathing  . Swelling of face and throat  . A fast heartbeat  . A bad rash all over body  . Dizziness and weakness   Immunizations Administered    Name Date Dose VIS Date Route   Moderna COVID-19 Vaccine 01/28/2020  4:48 PM 0.5 mL 10/29/2019 Intramuscular   Manufacturer: Moderna   Lot: [id number]   [id number]

## 2020-02-04 ENCOUNTER — Ambulatory Visit: Payer: Medicare HMO

## 2020-02-11 DIAGNOSIS — R69 Illness, unspecified: Secondary | ICD-10-CM | POA: Diagnosis not present

## 2020-02-13 DIAGNOSIS — Z299 Encounter for prophylactic measures, unspecified: Secondary | ICD-10-CM | POA: Diagnosis not present

## 2020-02-13 DIAGNOSIS — R69 Illness, unspecified: Secondary | ICD-10-CM | POA: Diagnosis not present

## 2020-02-13 DIAGNOSIS — Z6838 Body mass index (BMI) 38.0-38.9, adult: Secondary | ICD-10-CM | POA: Diagnosis not present

## 2020-02-13 DIAGNOSIS — M7662 Achilles tendinitis, left leg: Secondary | ICD-10-CM | POA: Diagnosis not present

## 2020-02-13 DIAGNOSIS — R609 Edema, unspecified: Secondary | ICD-10-CM | POA: Diagnosis not present

## 2020-02-13 DIAGNOSIS — I10 Essential (primary) hypertension: Secondary | ICD-10-CM | POA: Diagnosis not present

## 2020-03-10 DIAGNOSIS — Z7982 Long term (current) use of aspirin: Secondary | ICD-10-CM | POA: Diagnosis not present

## 2020-03-10 DIAGNOSIS — Z6837 Body mass index (BMI) 37.0-37.9, adult: Secondary | ICD-10-CM | POA: Diagnosis not present

## 2020-03-10 DIAGNOSIS — K219 Gastro-esophageal reflux disease without esophagitis: Secondary | ICD-10-CM | POA: Diagnosis not present

## 2020-03-10 DIAGNOSIS — H547 Unspecified visual loss: Secondary | ICD-10-CM | POA: Diagnosis not present

## 2020-03-10 DIAGNOSIS — Z720 Tobacco use: Secondary | ICD-10-CM | POA: Diagnosis not present

## 2020-03-10 DIAGNOSIS — M1990 Unspecified osteoarthritis, unspecified site: Secondary | ICD-10-CM | POA: Diagnosis not present

## 2020-03-10 DIAGNOSIS — I10 Essential (primary) hypertension: Secondary | ICD-10-CM | POA: Diagnosis not present

## 2020-03-10 DIAGNOSIS — N400 Enlarged prostate without lower urinary tract symptoms: Secondary | ICD-10-CM | POA: Diagnosis not present

## 2020-04-08 DIAGNOSIS — Z299 Encounter for prophylactic measures, unspecified: Secondary | ICD-10-CM | POA: Diagnosis not present

## 2020-04-08 DIAGNOSIS — Z713 Dietary counseling and surveillance: Secondary | ICD-10-CM | POA: Diagnosis not present

## 2020-04-08 DIAGNOSIS — I10 Essential (primary) hypertension: Secondary | ICD-10-CM | POA: Diagnosis not present

## 2020-04-08 DIAGNOSIS — Z6838 Body mass index (BMI) 38.0-38.9, adult: Secondary | ICD-10-CM | POA: Diagnosis not present

## 2020-06-23 DIAGNOSIS — M7731 Calcaneal spur, right foot: Secondary | ICD-10-CM | POA: Diagnosis not present

## 2020-06-23 DIAGNOSIS — M19072 Primary osteoarthritis, left ankle and foot: Secondary | ICD-10-CM | POA: Diagnosis not present

## 2020-06-23 DIAGNOSIS — Z299 Encounter for prophylactic measures, unspecified: Secondary | ICD-10-CM | POA: Diagnosis not present

## 2020-06-23 DIAGNOSIS — M7661 Achilles tendinitis, right leg: Secondary | ICD-10-CM | POA: Diagnosis not present

## 2020-06-23 DIAGNOSIS — K921 Melena: Secondary | ICD-10-CM | POA: Diagnosis not present

## 2020-06-23 DIAGNOSIS — M7732 Calcaneal spur, left foot: Secondary | ICD-10-CM | POA: Diagnosis not present

## 2020-06-23 DIAGNOSIS — I10 Essential (primary) hypertension: Secondary | ICD-10-CM | POA: Diagnosis not present

## 2020-06-23 DIAGNOSIS — M7662 Achilles tendinitis, left leg: Secondary | ICD-10-CM | POA: Diagnosis not present

## 2020-06-23 DIAGNOSIS — M545 Low back pain: Secondary | ICD-10-CM | POA: Diagnosis not present

## 2020-06-23 DIAGNOSIS — K649 Unspecified hemorrhoids: Secondary | ICD-10-CM | POA: Diagnosis not present

## 2020-06-23 DIAGNOSIS — M19071 Primary osteoarthritis, right ankle and foot: Secondary | ICD-10-CM | POA: Diagnosis not present

## 2020-06-30 DIAGNOSIS — M71571 Other bursitis, not elsewhere classified, right ankle and foot: Secondary | ICD-10-CM | POA: Diagnosis not present

## 2020-06-30 DIAGNOSIS — M71572 Other bursitis, not elsewhere classified, left ankle and foot: Secondary | ICD-10-CM | POA: Diagnosis not present

## 2020-06-30 DIAGNOSIS — M7661 Achilles tendinitis, right leg: Secondary | ICD-10-CM | POA: Diagnosis not present

## 2020-06-30 DIAGNOSIS — M7662 Achilles tendinitis, left leg: Secondary | ICD-10-CM | POA: Diagnosis not present

## 2020-07-07 DIAGNOSIS — M7661 Achilles tendinitis, right leg: Secondary | ICD-10-CM | POA: Diagnosis not present

## 2020-07-07 DIAGNOSIS — M7662 Achilles tendinitis, left leg: Secondary | ICD-10-CM | POA: Diagnosis not present

## 2020-07-07 DIAGNOSIS — M71572 Other bursitis, not elsewhere classified, left ankle and foot: Secondary | ICD-10-CM | POA: Diagnosis not present

## 2020-07-07 DIAGNOSIS — M71571 Other bursitis, not elsewhere classified, right ankle and foot: Secondary | ICD-10-CM | POA: Diagnosis not present

## 2020-07-09 ENCOUNTER — Encounter: Payer: Self-pay | Admitting: Internal Medicine

## 2020-07-20 ENCOUNTER — Ambulatory Visit: Payer: Medicare HMO | Admitting: Podiatry

## 2020-07-20 ENCOUNTER — Other Ambulatory Visit: Payer: Self-pay

## 2020-07-20 ENCOUNTER — Ambulatory Visit (INDEPENDENT_AMBULATORY_CARE_PROVIDER_SITE_OTHER): Payer: Medicare HMO

## 2020-07-20 DIAGNOSIS — M7661 Achilles tendinitis, right leg: Secondary | ICD-10-CM

## 2020-07-20 DIAGNOSIS — M7662 Achilles tendinitis, left leg: Secondary | ICD-10-CM | POA: Diagnosis not present

## 2020-07-20 MED ORDER — METHYLPREDNISOLONE 4 MG PO TBPK: ORAL_TABLET | ORAL | 0 refills | Status: DC

## 2020-07-20 MED ORDER — MELOXICAM 15 MG PO TABS: 15.0000 mg | ORAL_TABLET | Freq: Every day | ORAL | 1 refills | Status: DC

## 2020-07-20 NOTE — Addendum Note (Signed)
Addended by: Sindy Messing A on: 07/20/2020 04:18 PM   Modules accepted: Orders

## 2020-07-20 NOTE — Progress Notes (Signed)
   HPI: 69 y.o. male presenting today as a new patient referral from Dr. Gershon Mussel, local podiatrist, for bilateral heel pain.  Patient has been dealing with the heel pain progressively for greater than 1 year.  Patient states that he was diagnosed with heel spurs and presents today for possible surgical consultation.  He has tried injections in the past and is currently wearing a cam boot with moderate relief    Past Medical History:  Diagnosis Date  . BPH (benign prostatic hyperplasia)   . GERD (gastroesophageal reflux disease)   . HTN (hypertension)   . Kidney stone       Physical Exam: General: The patient is alert and oriented x3 in no acute distress.  Dermatology: Skin is warm, dry and supple bilateral lower extremities. Negative for open lesions or macerations.  Vascular: Palpable pedal pulses bilaterally. No edema or erythema noted. Capillary refill within normal limits.  Neurological: Epicritic and protective threshold grossly intact bilaterally.   Musculoskeletal Exam: Pain on palpation noted to the posterior tubercle of the bilateral calcaneus at the insertion of the Achilles tendon consistent with retrocalcaneal bursitis. Range of motion within normal limits. Muscle strength 5/5 in all muscle groups bilateral lower extremities.  Radiographic Exam:  Posterior calcaneal spur noted to the respective calcaneus on lateral view. No fracture or dislocation noted. Normal osseous mineralization noted.     Assessment: 1. Insertional Achilles tendinitis bilateral 2.  Posterior heel spur bilateral  3.  Retrocalcaneal bursitis   Plan of Care:  1. Patient was evaluated. Radiographs were reviewed today. 2. Injection of 0.5 mL Celestone Soluspan injected into the retrocalcaneal bursa bilateral. Care was taken to avoid direct injection into the Achilles tendon. 3.  Prescription for Medrol Dosepak 4.  Prescription for meloxicam 15 mg daily.  Initiate after completion of the Dosepak 5.   Recommend good supportive sneakers 6.  Order placed for physical therapy at [facility] 7.  Return to clinic in 4 weeks  *Likes to play golf.  Retired.   Edrick Kins, DPM [facility]  Dr. Edrick Kins, [address]                                        [place], [place] [address]                Office [phone]  Fax [phone]

## 2020-07-24 DIAGNOSIS — M25672 Stiffness of left ankle, not elsewhere classified: Secondary | ICD-10-CM | POA: Diagnosis not present

## 2020-07-24 DIAGNOSIS — M25572 Pain in left ankle and joints of left foot: Secondary | ICD-10-CM | POA: Diagnosis not present

## 2020-07-24 DIAGNOSIS — M799 Soft tissue disorder, unspecified: Secondary | ICD-10-CM | POA: Diagnosis not present

## 2020-07-24 DIAGNOSIS — M25671 Stiffness of right ankle, not elsewhere classified: Secondary | ICD-10-CM | POA: Diagnosis not present

## 2020-07-27 DIAGNOSIS — M799 Soft tissue disorder, unspecified: Secondary | ICD-10-CM | POA: Diagnosis not present

## 2020-07-27 DIAGNOSIS — E6609 Other obesity due to excess calories: Secondary | ICD-10-CM | POA: Diagnosis not present

## 2020-07-27 DIAGNOSIS — R262 Difficulty in walking, not elsewhere classified: Secondary | ICD-10-CM | POA: Diagnosis not present

## 2020-07-27 DIAGNOSIS — M25671 Stiffness of right ankle, not elsewhere classified: Secondary | ICD-10-CM | POA: Diagnosis not present

## 2020-07-27 DIAGNOSIS — M25571 Pain in right ankle and joints of right foot: Secondary | ICD-10-CM | POA: Diagnosis not present

## 2020-07-27 DIAGNOSIS — R531 Weakness: Secondary | ICD-10-CM | POA: Diagnosis not present

## 2020-07-27 DIAGNOSIS — I10 Essential (primary) hypertension: Secondary | ICD-10-CM | POA: Diagnosis not present

## 2020-07-27 DIAGNOSIS — M25572 Pain in left ankle and joints of left foot: Secondary | ICD-10-CM | POA: Diagnosis not present

## 2020-07-27 DIAGNOSIS — M25672 Stiffness of left ankle, not elsewhere classified: Secondary | ICD-10-CM | POA: Diagnosis not present

## 2020-07-31 DIAGNOSIS — E6609 Other obesity due to excess calories: Secondary | ICD-10-CM | POA: Diagnosis not present

## 2020-07-31 DIAGNOSIS — M25672 Stiffness of left ankle, not elsewhere classified: Secondary | ICD-10-CM | POA: Diagnosis not present

## 2020-07-31 DIAGNOSIS — M799 Soft tissue disorder, unspecified: Secondary | ICD-10-CM | POA: Diagnosis not present

## 2020-07-31 DIAGNOSIS — M25571 Pain in right ankle and joints of right foot: Secondary | ICD-10-CM | POA: Diagnosis not present

## 2020-07-31 DIAGNOSIS — R531 Weakness: Secondary | ICD-10-CM | POA: Diagnosis not present

## 2020-07-31 DIAGNOSIS — R262 Difficulty in walking, not elsewhere classified: Secondary | ICD-10-CM | POA: Diagnosis not present

## 2020-07-31 DIAGNOSIS — M25671 Stiffness of right ankle, not elsewhere classified: Secondary | ICD-10-CM | POA: Diagnosis not present

## 2020-07-31 DIAGNOSIS — M25572 Pain in left ankle and joints of left foot: Secondary | ICD-10-CM | POA: Diagnosis not present

## 2020-07-31 DIAGNOSIS — I10 Essential (primary) hypertension: Secondary | ICD-10-CM | POA: Diagnosis not present

## 2020-08-04 DIAGNOSIS — R262 Difficulty in walking, not elsewhere classified: Secondary | ICD-10-CM | POA: Diagnosis not present

## 2020-08-04 DIAGNOSIS — M25571 Pain in right ankle and joints of right foot: Secondary | ICD-10-CM | POA: Diagnosis not present

## 2020-08-04 DIAGNOSIS — M25572 Pain in left ankle and joints of left foot: Secondary | ICD-10-CM | POA: Diagnosis not present

## 2020-08-04 DIAGNOSIS — M25671 Stiffness of right ankle, not elsewhere classified: Secondary | ICD-10-CM | POA: Diagnosis not present

## 2020-08-04 DIAGNOSIS — M799 Soft tissue disorder, unspecified: Secondary | ICD-10-CM | POA: Diagnosis not present

## 2020-08-04 DIAGNOSIS — E6609 Other obesity due to excess calories: Secondary | ICD-10-CM | POA: Diagnosis not present

## 2020-08-04 DIAGNOSIS — I10 Essential (primary) hypertension: Secondary | ICD-10-CM | POA: Diagnosis not present

## 2020-08-04 DIAGNOSIS — M25672 Stiffness of left ankle, not elsewhere classified: Secondary | ICD-10-CM | POA: Diagnosis not present

## 2020-08-04 DIAGNOSIS — R531 Weakness: Secondary | ICD-10-CM | POA: Diagnosis not present

## 2020-08-06 ENCOUNTER — Other Ambulatory Visit: Payer: Self-pay

## 2020-08-06 ENCOUNTER — Ambulatory Visit: Payer: Medicare HMO | Attending: Internal Medicine

## 2020-08-06 ENCOUNTER — Other Ambulatory Visit: Payer: Medicare HMO

## 2020-08-06 DIAGNOSIS — Z23 Encounter for immunization: Secondary | ICD-10-CM

## 2020-08-06 DIAGNOSIS — Z20822 Contact with and (suspected) exposure to covid-19: Secondary | ICD-10-CM

## 2020-08-06 NOTE — Progress Notes (Signed)
   Covid-19 Vaccination Clinic  Name:  Stephen Hamilton    MRN: [id number] DOB: 1951/09/27  08/06/2020  [name] was observed post Covid-19 immunization for 15 minutes without incident. He was provided with Vaccine Information Sheet and instruction to access the V-Safe system.   [name] was instructed to call 911 with any severe reactions post vaccine: Marland Kitchen Difficulty breathing  . Swelling of face and throat  . A fast heartbeat  . A bad rash all over body  . Dizziness and weakness

## 2020-08-07 DIAGNOSIS — M25571 Pain in right ankle and joints of right foot: Secondary | ICD-10-CM | POA: Diagnosis not present

## 2020-08-07 DIAGNOSIS — M25572 Pain in left ankle and joints of left foot: Secondary | ICD-10-CM | POA: Diagnosis not present

## 2020-08-07 DIAGNOSIS — M25671 Stiffness of right ankle, not elsewhere classified: Secondary | ICD-10-CM | POA: Diagnosis not present

## 2020-08-07 DIAGNOSIS — R262 Difficulty in walking, not elsewhere classified: Secondary | ICD-10-CM | POA: Diagnosis not present

## 2020-08-07 DIAGNOSIS — R531 Weakness: Secondary | ICD-10-CM | POA: Diagnosis not present

## 2020-08-07 DIAGNOSIS — M799 Soft tissue disorder, unspecified: Secondary | ICD-10-CM | POA: Diagnosis not present

## 2020-08-07 DIAGNOSIS — M25672 Stiffness of left ankle, not elsewhere classified: Secondary | ICD-10-CM | POA: Diagnosis not present

## 2020-08-07 DIAGNOSIS — I10 Essential (primary) hypertension: Secondary | ICD-10-CM | POA: Diagnosis not present

## 2020-08-07 DIAGNOSIS — E6609 Other obesity due to excess calories: Secondary | ICD-10-CM | POA: Diagnosis not present

## 2020-08-08 LAB — NOVEL CORONAVIRUS, NAA: SARS-CoV-2, NAA: NOT DETECTED

## 2020-08-08 LAB — SARS-COV-2, NAA 2 DAY TAT

## 2020-08-13 ENCOUNTER — Encounter: Payer: Self-pay | Admitting: Internal Medicine

## 2020-08-13 ENCOUNTER — Ambulatory Visit (INDEPENDENT_AMBULATORY_CARE_PROVIDER_SITE_OTHER): Payer: Medicare HMO | Admitting: Internal Medicine

## 2020-08-13 ENCOUNTER — Other Ambulatory Visit: Payer: Self-pay

## 2020-08-13 ENCOUNTER — Telehealth: Payer: Self-pay | Admitting: *Deleted

## 2020-08-13 VITALS — BP 143/82 | HR 63 | Temp 97.3°F | Ht 70.0 in | Wt 260.6 lb

## 2020-08-13 DIAGNOSIS — K648 Other hemorrhoids: Secondary | ICD-10-CM | POA: Diagnosis not present

## 2020-08-13 DIAGNOSIS — K219 Gastro-esophageal reflux disease without esophagitis: Secondary | ICD-10-CM | POA: Diagnosis not present

## 2020-08-13 DIAGNOSIS — D126 Benign neoplasm of colon, unspecified: Secondary | ICD-10-CM | POA: Diagnosis not present

## 2020-08-13 NOTE — Telephone Encounter (Signed)
Called pt. He has been scheduled for TCS with Dr. Abbey Chatters asa 2 on 10/11 at 8:45am. covid scheduled for 10/7 at 3:00pm. Confirmed mailing address. Pt also on [facility]

## 2020-08-13 NOTE — Patient Instructions (Signed)
We will schedule you for a colonoscopy today in clinic due to rectal bleeding and history of adenomatous polyps.  If no other identifiable source of bleeding found on colonoscopy, we will schedule you for hemorrhoid banding.  Continue on Protonix daily for chronic reflux.  Okay to try and wean off as tolerated.  Lifestyle and home remedies TO MANAGE REFLUX/HEARTBURN    You may eliminate or reduce the frequency of heartburn by making the following lifestyle changes:    Control your weight. Being overweight is a major risk factor for heartburn and GERD. Excess pounds put pressure on your abdomen, pushing up your stomach and causing acid to back up into your esophagus.     Eat smaller meals. 4 TO 6 MEALS A DAY. This reduces pressure on the lower esophageal sphincter, helping to prevent the valve from opening and acid from washing back into your esophagus.      Loosen your belt. Clothes that fit tightly around your waist put pressure on your abdomen and the lower esophageal sphincter.      Eliminate heartburn triggers. Everyone has specific triggers. Common triggers such as fatty or fried foods, spicy food, tomato sauce, carbonated beverages, alcohol, chocolate, mint, garlic, onion, caffeine and nicotine may make heartburn worse.     Avoid stooping or bending. Tying your shoes is OK. Bending over for longer periods to weed your garden isn't, especially soon after eating.     Don't lie down after a meal. Wait at least three to four hours after eating before going to bed, and don't lie down right after eating.   At [facility] we value your feedback. You may receive a survey about your visit today. Please share your experience as we strive to create trusting relationships with our patients to provide genuine, compassionate, quality care.  We appreciate your understanding and patience as we review any laboratory studies, imaging, and other diagnostic tests that are ordered  as we care for you. Our office policy is 5 business days for review of these results, and any emergent or urgent results are addressed in a timely manner for your best interest. If you do not hear from our office in 1 week, please contact us.   We also encourage the use of MyChart, which contains your medical information for your review as well. If you are not enrolled in this feature, an access code is on this after visit summary for your convenience. Thank you for allowing [place] to be involved in your care.  It was great to see you today!  I hope you have a great rest of your summer!!     Hit them straight!  Elon Alas. Abbey Chatters, D.O. Gastroenterology and Hepatology [facility]

## 2020-08-13 NOTE — H&P (View-Only) (Signed)
Referring Provider: Glenda Chroman, MD Primary Care Physician:  Glenda Chroman, MD Primary GI:  Dr. Abbey Chatters  Chief Complaint  Patient presents with  . Hemorrhoids    c/o burning/itching at times, blood in stool/toliet water, intermitten. last TCS 2014    HPI:   Stephen Hamilton is a 69 y.o. male who presents to the clinic today for follow-up visit.  He was previously seen by Dr. Oneida Alar, last seen 2019.  He had a colonoscopy in 2014 with 1 tubular adenoma removed.  Had internal hemorrhoids at that time.  He states recently his rectal bleeding has worsened.  He is concerned about his hemorrhoids.  Appears it was recommended that he undergo hemorrhoid banding in the past but patient decided to hold off.  He is interested in having this done now.  No abdominal pain.  No unintentional weight loss.  No family history of colorectal malignancy.  Does have chronic reflux which is well controlled on pantoprazole 40 mg daily.  He is wondering if he can wean off of this.  Patient enjoys playing golf in his free time.  Past Medical History:  Diagnosis Date  . BPH (benign prostatic hyperplasia)   . GERD (gastroesophageal reflux disease)   . HTN (hypertension)   . Kidney stone     Past Surgical History:  Procedure Laterality Date  . COLONOSCOPY  05/2008   Dr. Anthony Sar, normal  . COLONOSCOPY N/A 09/25/2013   SLF: 1. the colon was redundant 2. One colon polyp removed  3.  Rectal bleeding due to large internal hemorrhoids  . ESOPHAGOGASTRODUODENOSCOPY     [place] per patient, unremarkable  . HEMORRHOID BANDING N/A 09/25/2013   Procedure: HEMORRHOID BANDING;  Surgeon: Danie Binder, MD;  [facility]: [facility];  Service: Endoscopy;  Laterality: N/A;  . HIATAL HERNIA REPAIR    . THYROID SURGERY      Current Outpatient Medications  Medication Sig Dispense Refill  . amLODipine (NORVASC) 5 MG tablet 5 mg daily.    . meloxicam (MOBIC) 15 MG tablet Take 1 tablet (15 mg total) by mouth daily. (Patient  taking differently: Take 15 mg by mouth daily. As needed) 30 tablet 1  . metoprolol succinate (TOPROL-XL) 100 MG 24 hr tablet 100 mg daily.    . pantoprazole (PROTONIX) 40 MG tablet Take 40 mg by mouth daily.  3  . sildenafil (REVATIO) 20 MG tablet Take 60 mg by mouth daily. As needed    . tamsulosin (FLOMAX) 0.4 MG CAPS capsule 0.4 mg daily.    Marland Kitchen triamcinolone cream (KENALOG) 0.1 % Apply topically 2 (two) times daily. As needed     No current facility-administered medications for this visit.    Allergies as of 08/13/2020 - Review Complete 08/13/2020  Allergen Reaction Noted  . Other  11/19/2014    Family History  Problem Relation Age of Onset  . Colon cancer Neg Hx     Social History   Socioeconomic History  . Marital status: Married    Spouse name: Not on file  . Number of children: Not on file  . Years of education: Not on file  . Highest education level: Not on file  Occupational History  . Occupation: retired    Fish farm manager: [employer]  Tobacco Use  . Smoking status: Current Every Day Smoker    Packs/day: 1.00    Types: Cigarettes    Start date: 09/10/1971  . Smokeless tobacco: Never Used  Substance and Sexual Activity  . Alcohol  use: No  . Drug use: No  . Sexual activity: Not on file  Other Topics Concern  . Not on file  Social History Narrative  . Not on file   Social Determinants of Health   Financial Resource Strain:   . Difficulty of Paying Living Expenses: Not on file  Food Insecurity:   . Worried About Charity fundraiser in the Last Year: Not on file  . Ran Out of Food in the Last Year: Not on file  Transportation Needs:   . Lack of Transportation (Medical): Not on file  . Lack of Transportation (Non-Medical): Not on file  Physical Activity:   . Days of Exercise per Week: Not on file  . Minutes of Exercise per Session: Not on file  Stress:   . Feeling of Stress : Not on file  Social Connections:   . Frequency of Communication with Friends  and Family: Not on file  . Frequency of Social Gatherings with Friends and Family: Not on file  . Attends Religious Services: Not on file  . Active Member of Clubs or Organizations: Not on file  . Attends Archivist Meetings: Not on file  . Marital Status: Not on file    Subjective: Review of Systems  Constitutional: Negative for chills and fever.  HENT: Negative for congestion and hearing loss.   Eyes: Negative for blurred vision and double vision.  Respiratory: Negative for cough and shortness of breath.   Cardiovascular: Negative for chest pain and palpitations.  Gastrointestinal: Positive for blood in stool and heartburn. Negative for abdominal pain, constipation, diarrhea, melena and vomiting.  Genitourinary: Negative for dysuria and urgency.  Musculoskeletal: Negative for joint pain and myalgias.  Skin: Negative for itching and rash.  Neurological: Negative for dizziness and headaches.  Psychiatric/Behavioral: Negative for depression. The patient is not nervous/anxious.      Objective: BP (!) 143/82   Pulse 63   Temp (!) 97.3 F (36.3 C) (Oral)   Ht 5\' 10"  (1.778 m)   Wt 260 lb 9.6 oz (118.2 kg)   BMI 37.39 kg/m  Physical Exam Constitutional:      Appearance: Normal appearance.  HENT:     Head: Normocephalic and atraumatic.  Eyes:     Extraocular Movements: Extraocular movements intact.     Conjunctiva/sclera: Conjunctivae normal.  Cardiovascular:     Rate and Rhythm: Normal rate and regular rhythm.  Pulmonary:     Effort: Pulmonary effort is normal.     Breath sounds: Normal breath sounds.  Abdominal:     General: Bowel sounds are normal.     Palpations: Abdomen is soft.  Musculoskeletal:        General: Normal range of motion.     Cervical back: Normal range of motion and neck supple.  Skin:    General: Skin is warm.  Neurological:     General: No focal deficit present.     Mental Status: He is alert and oriented to person, place, and time.    Psychiatric:        Mood and Affect: Mood normal.        Behavior: Behavior normal.      Assessment: *Rectal bleeding-worsening, likely hemorrhoidal *History of adenomatous polyp *Chronic reflux-well-controlled on pantoprazole  Plan: Etiology of patient's worsening rectal bleeding likely due to internal hemorrhoids.  However we will need to update colonoscopy at this time given his history of adenomatous polyp as well as recent increase in amount of bleeding.  The risks including infection, bleed, or perforation as well as benefits, limitations, alternatives and imponderables have been reviewed with the patient. Questions have been answered. All parties agreeable.  Continue on Protonix for chronic reflux which is well controlled.  I did counsel him that he can attempt to wean off as tolerated.  I also printed off instructions for practices at home to decrease amount of reflux.  Further recommendations to follow. 08/13/2020 8:46 AM   Disclaimer: This note was dictated with voice recognition software. Similar sounding words can inadvertently be transcribed and may not be corrected upon review.

## 2020-08-13 NOTE — Progress Notes (Signed)
Referring Provider: Glenda Chroman, MD Primary Care Physician:  Glenda Chroman, MD Primary GI:  Dr. Abbey Chatters  Chief Complaint  Patient presents with  . Hemorrhoids    c/o burning/itching at times, blood in stool/toliet water, intermitten. last TCS 2014    HPI:   Stephen Hamilton is a 69 y.o. male who presents to the clinic today for follow-up visit.  He was previously seen by Dr. Oneida Alar, last seen 2019.  He had a colonoscopy in 2014 with 1 tubular adenoma removed.  Had internal hemorrhoids at that time.  He states recently his rectal bleeding has worsened.  He is concerned about his hemorrhoids.  Appears it was recommended that he undergo hemorrhoid banding in the past but patient decided to hold off.  He is interested in having this done now.  No abdominal pain.  No unintentional weight loss.  No family history of colorectal malignancy.  Does have chronic reflux which is well controlled on pantoprazole 40 mg daily.  He is wondering if he can wean off of this.  Patient enjoys playing golf in his free time.  Past Medical History:  Diagnosis Date  . BPH (benign prostatic hyperplasia)   . GERD (gastroesophageal reflux disease)   . HTN (hypertension)   . Kidney stone     Past Surgical History:  Procedure Laterality Date  . COLONOSCOPY  05/2008   Dr. Anthony Sar, normal  . COLONOSCOPY N/A 09/25/2013   SLF: 1. the colon was redundant 2. One colon polyp removed  3.  Rectal bleeding due to large internal hemorrhoids  . ESOPHAGOGASTRODUODENOSCOPY     [place] per patient, unremarkable  . HEMORRHOID BANDING N/A 09/25/2013   Procedure: HEMORRHOID BANDING;  Surgeon: Danie Binder, MD;  Location: [facility];  Service: Endoscopy;  Laterality: N/A;  . HIATAL HERNIA REPAIR    . THYROID SURGERY      Current Outpatient Medications  Medication Sig Dispense Refill  . amLODipine (NORVASC) 5 MG tablet 5 mg daily.    . meloxicam (MOBIC) 15 MG tablet Take 1 tablet (15 mg total) by mouth daily. (Patient  taking differently: Take 15 mg by mouth daily. As needed) 30 tablet 1  . metoprolol succinate (TOPROL-XL) 100 MG 24 hr tablet 100 mg daily.    . pantoprazole (PROTONIX) 40 MG tablet Take 40 mg by mouth daily.  3  . sildenafil (REVATIO) 20 MG tablet Take 60 mg by mouth daily. As needed    . tamsulosin (FLOMAX) 0.4 MG CAPS capsule 0.4 mg daily.    Marland Kitchen triamcinolone cream (KENALOG) 0.1 % Apply topically 2 (two) times daily. As needed     No current facility-administered medications for this visit.    Allergies as of 08/13/2020 - Review Complete 08/13/2020  Allergen Reaction Noted  . Other  11/19/2014    Family History  Problem Relation Age of Onset  . Colon cancer Neg Hx     Social History   Socioeconomic History  . Marital status: Married    Spouse name: Not on file  . Number of children: Not on file  . Years of education: Not on file  . Highest education level: Not on file  Occupational History  . Occupation: retired    Fish farm manager: [employer]  Tobacco Use  . Smoking status: Current Every Day Smoker    Packs/day: 1.00    Types: Cigarettes    Start date: 09/10/1971  . Smokeless tobacco: Never Used  Substance and Sexual Activity  . Alcohol  use: No  . Drug use: No  . Sexual activity: Not on file  Other Topics Concern  . Not on file  Social History Narrative  . Not on file   Social Determinants of Health   Financial Resource Strain:   . Difficulty of Paying Living Expenses: Not on file  Food Insecurity:   . Worried About Charity fundraiser in the Last Year: Not on file  . Ran Out of Food in the Last Year: Not on file  Transportation Needs:   . Lack of Transportation (Medical): Not on file  . Lack of Transportation (Non-Medical): Not on file  Physical Activity:   . Days of Exercise per Week: Not on file  . Minutes of Exercise per Session: Not on file  Stress:   . Feeling of Stress : Not on file  Social Connections:   . Frequency of Communication with Friends  and Family: Not on file  . Frequency of Social Gatherings with Friends and Family: Not on file  . Attends Religious Services: Not on file  . Active Member of Clubs or Organizations: Not on file  . Attends Archivist Meetings: Not on file  . Marital Status: Not on file    Subjective: Review of Systems  Constitutional: Negative for chills and fever.  HENT: Negative for congestion and hearing loss.   Eyes: Negative for blurred vision and double vision.  Respiratory: Negative for cough and shortness of breath.   Cardiovascular: Negative for chest pain and palpitations.  Gastrointestinal: Positive for blood in stool and heartburn. Negative for abdominal pain, constipation, diarrhea, melena and vomiting.  Genitourinary: Negative for dysuria and urgency.  Musculoskeletal: Negative for joint pain and myalgias.  Skin: Negative for itching and rash.  Neurological: Negative for dizziness and headaches.  Psychiatric/Behavioral: Negative for depression. The patient is not nervous/anxious.      Objective: BP (!) 143/82   Pulse 63   Temp (!) 97.3 F (36.3 C) (Oral)   Ht 5\' 10"  (1.778 m)   Wt 260 lb 9.6 oz (118.2 kg)   BMI 37.39 kg/m  Physical Exam Constitutional:      Appearance: Normal appearance.  HENT:     Head: Normocephalic and atraumatic.  Eyes:     Extraocular Movements: Extraocular movements intact.     Conjunctiva/sclera: Conjunctivae normal.  Cardiovascular:     Rate and Rhythm: Normal rate and regular rhythm.  Pulmonary:     Effort: Pulmonary effort is normal.     Breath sounds: Normal breath sounds.  Abdominal:     General: Bowel sounds are normal.     Palpations: Abdomen is soft.  Musculoskeletal:        General: Normal range of motion.     Cervical back: Normal range of motion and neck supple.  Skin:    General: Skin is warm.  Neurological:     General: No focal deficit present.     Mental Status: He is alert and oriented to person, place, and time.    Psychiatric:        Mood and Affect: Mood normal.        Behavior: Behavior normal.      Assessment: *Rectal bleeding-worsening, likely hemorrhoidal *History of adenomatous polyp *Chronic reflux-well-controlled on pantoprazole  Plan: Etiology of patient's worsening rectal bleeding likely due to internal hemorrhoids.  However we will need to update colonoscopy at this time given his history of adenomatous polyp as well as recent increase in amount of bleeding.  The risks including infection, bleed, or perforation as well as benefits, limitations, alternatives and imponderables have been reviewed with the patient. Questions have been answered. All parties agreeable.  Continue on Protonix for chronic reflux which is well controlled.  I did counsel him that he can attempt to wean off as tolerated.  I also printed off instructions for practices at home to decrease amount of reflux.  Further recommendations to follow. 08/13/2020 8:46 AM   Disclaimer: This note was dictated with voice recognition software. Similar sounding words can inadvertently be transcribed and may not be corrected upon review.

## 2020-08-24 ENCOUNTER — Ambulatory Visit: Payer: Medicare HMO | Admitting: Podiatry

## 2020-08-24 ENCOUNTER — Other Ambulatory Visit: Payer: Self-pay

## 2020-08-24 DIAGNOSIS — M7662 Achilles tendinitis, left leg: Secondary | ICD-10-CM

## 2020-08-24 DIAGNOSIS — M7661 Achilles tendinitis, right leg: Secondary | ICD-10-CM | POA: Diagnosis not present

## 2020-08-24 DIAGNOSIS — M722 Plantar fascial fibromatosis: Secondary | ICD-10-CM | POA: Diagnosis not present

## 2020-08-24 NOTE — Progress Notes (Signed)
° °  HPI: 69 y.o. male presenting today for follow-up evaluation of Achilles tendinitis to the bilateral ankles.  He states that he is significantly better.  He says that the injections plus the medication helped significantly.  He has been going to physical therapy 2 times per week as well. Today he states that although his Achilles tendons are feeling better he has been having some pain to the plantar aspect of the heel as well.  It is painful on hardwood floors around the house.  He states that he does not go barefoot and has house slippers.  He presents for further treatment and evaluation   Past Medical History:  Diagnosis Date   BPH (benign prostatic hyperplasia)    GERD (gastroesophageal reflux disease)    HTN (hypertension)    Kidney stone       Physical Exam: General: The patient is alert and oriented x3 in no acute distress.  Dermatology: Skin is warm, dry and supple bilateral lower extremities. Negative for open lesions or macerations.  Vascular: Palpable pedal pulses bilaterally. No edema or erythema noted. Capillary refill within normal limits.  Neurological: Epicritic and protective threshold grossly intact bilaterally.   Musculoskeletal Exam: Improved pain on palpation noted to the posterior tubercle of the bilateral calcaneus at the insertion of the Achilles tendon consistent with retrocalcaneal bursitis. Range of motion within normal limits. Muscle strength 5/5 in all muscle groups bilateral lower extremities. Today there is also some pain on palpation to the plantar fascia bilateral.   Assessment: 1. Insertional Achilles tendinitis bilateral 2.  Posterior heel spur bilateral  3.  Retrocalcaneal bursitis 4.  Plantar fasciitis bilateral   Plan of Care:  1. Patient was evaluated. 2. Injection of 0.5 mL Celestone Soluspan injected into the plantar fascia bilateral 3.  Continue meloxicam as needed.  Patient states that he does not like the way that it makes him  feel 4.  Continue PT at [facility] as needed.  Patient did complain of the $35 co-pay each time he goes to physical therapy 5.  Continue wearing good supportive shoes 6.  Return to clinic in 6 weeks  *Likes to play golf.  Retired.   Edrick Kins, DPM [facility]  Dr. Edrick Kins, [place]                                        [address], [place] [address]                Office [phone]  Fax [phone]

## 2020-09-03 ENCOUNTER — Other Ambulatory Visit: Payer: Self-pay

## 2020-09-03 ENCOUNTER — Other Ambulatory Visit (HOSPITAL_COMMUNITY)
Admission: RE | Admit: 2020-09-03 | Discharge: 2020-09-03 | Disposition: A | Discharge status: Home / Self Care | Payer: Medicare HMO | Source: Ambulatory Visit | Attending: Internal Medicine | Admitting: Internal Medicine

## 2020-09-03 DIAGNOSIS — Z20822 Contact with and (suspected) exposure to covid-19: Secondary | ICD-10-CM | POA: Insufficient documentation

## 2020-09-03 DIAGNOSIS — Z01818 Encounter for other preprocedural examination: Secondary | ICD-10-CM | POA: Diagnosis not present

## 2020-09-04 LAB — SARS CORONAVIRUS 2 (TAT 6-24 HRS): SARS Coronavirus 2: NEGATIVE

## 2020-09-07 ENCOUNTER — Encounter (HOSPITAL_COMMUNITY)
Admission: RE | Disposition: A | Discharge status: Home / Self Care | Payer: Self-pay | Source: Home / Self Care | Attending: Internal Medicine

## 2020-09-07 ENCOUNTER — Ambulatory Visit (HOSPITAL_COMMUNITY): Payer: Medicare HMO | Admitting: Anesthesiology

## 2020-09-07 ENCOUNTER — Other Ambulatory Visit: Payer: Self-pay

## 2020-09-07 ENCOUNTER — Ambulatory Visit (HOSPITAL_COMMUNITY)
Admission: RE | Admit: 2020-09-07 | Discharge: 2020-09-07 | Disposition: A | Discharge status: Home / Self Care | Payer: Medicare HMO | Attending: Internal Medicine | Admitting: Internal Medicine

## 2020-09-07 ENCOUNTER — Encounter (HOSPITAL_COMMUNITY): Payer: Self-pay

## 2020-09-07 DIAGNOSIS — D123 Benign neoplasm of transverse colon: Secondary | ICD-10-CM | POA: Diagnosis not present

## 2020-09-07 DIAGNOSIS — Z79899 Other long term (current) drug therapy: Secondary | ICD-10-CM | POA: Insufficient documentation

## 2020-09-07 DIAGNOSIS — K648 Other hemorrhoids: Secondary | ICD-10-CM | POA: Insufficient documentation

## 2020-09-07 DIAGNOSIS — K635 Polyp of colon: Secondary | ICD-10-CM | POA: Diagnosis not present

## 2020-09-07 DIAGNOSIS — Z86010 Personal history of colonic polyps: Secondary | ICD-10-CM | POA: Diagnosis not present

## 2020-09-07 DIAGNOSIS — N400 Enlarged prostate without lower urinary tract symptoms: Secondary | ICD-10-CM | POA: Diagnosis not present

## 2020-09-07 DIAGNOSIS — I10 Essential (primary) hypertension: Secondary | ICD-10-CM | POA: Diagnosis not present

## 2020-09-07 DIAGNOSIS — K649 Unspecified hemorrhoids: Secondary | ICD-10-CM | POA: Diagnosis not present

## 2020-09-07 DIAGNOSIS — K625 Hemorrhage of anus and rectum: Secondary | ICD-10-CM | POA: Diagnosis not present

## 2020-09-07 DIAGNOSIS — K5730 Diverticulosis of large intestine without perforation or abscess without bleeding: Secondary | ICD-10-CM | POA: Insufficient documentation

## 2020-09-07 DIAGNOSIS — K219 Gastro-esophageal reflux disease without esophagitis: Secondary | ICD-10-CM | POA: Diagnosis not present

## 2020-09-07 DIAGNOSIS — K644 Residual hemorrhoidal skin tags: Secondary | ICD-10-CM | POA: Insufficient documentation

## 2020-09-07 DIAGNOSIS — Z791 Long term (current) use of non-steroidal anti-inflammatories (NSAID): Secondary | ICD-10-CM | POA: Insufficient documentation

## 2020-09-07 DIAGNOSIS — R69 Illness, unspecified: Secondary | ICD-10-CM | POA: Diagnosis not present

## 2020-09-07 DIAGNOSIS — F17210 Nicotine dependence, cigarettes, uncomplicated: Secondary | ICD-10-CM | POA: Diagnosis not present

## 2020-09-07 HISTORY — PX: POLYPECTOMY: SHX5525

## 2020-09-07 HISTORY — PX: COLONOSCOPY WITH PROPOFOL: SHX5780

## 2020-09-07 SURGERY — COLONOSCOPY WITH PROPOFOL
Anesthesia: General

## 2020-09-07 MED ORDER — LACTATED RINGERS IV SOLN: Freq: Once | INTRAVENOUS | Status: AC

## 2020-09-07 MED ORDER — LACTATED RINGERS IV SOLN: INTRAVENOUS | Status: DC | PRN

## 2020-09-07 MED ORDER — CHLORHEXIDINE GLUCONATE CLOTH 2 % EX PADS: 6.0000 | MEDICATED_PAD | Freq: Once | CUTANEOUS | Status: DC

## 2020-09-07 MED ORDER — HYDROCORTISONE (PERIANAL) 2.5 % EX CREA: 1.0000 "application " | TOPICAL_CREAM | Freq: Two times a day (BID) | CUTANEOUS | 2 refills | Status: DC

## 2020-09-07 MED ORDER — PROPOFOL 500 MG/50ML IV EMUL
INTRAVENOUS | Status: DC | PRN
  Administered 2020-09-07: 150 ug/kg/min via INTRAVENOUS

## 2020-09-07 MED ORDER — PROPOFOL 10 MG/ML IV BOLUS
INTRAVENOUS | Status: DC | PRN
  Administered 2020-09-07 (×2): 50 mg via INTRAVENOUS

## 2020-09-07 NOTE — Interval H&P Note (Signed)
History and Physical Interval Note:  09/07/2020 8:14 AM  Stephen Hamilton  has presented today for surgery, with the diagnosis of hx polyps, hemorrhoids.  The various methods of treatment have been discussed with the patient and family. After consideration of risks, benefits and other options for treatment, the patient has consented to  Procedure(s) with comments: COLONOSCOPY WITH PROPOFOL (N/A) - 8:45am as a surgical intervention.  The patient's history has been reviewed, patient examined, no change in status, stable for surgery.  I have reviewed the patient's chart and labs.  Questions were answered to the patient's satisfaction.     Eloise Harman

## 2020-09-07 NOTE — Transfer of Care (Signed)
Immediate Anesthesia Transfer of Care Note  Patient: Stephen Hamilton  Procedure(s) Performed: COLONOSCOPY WITH PROPOFOL (N/A ) POLYPECTOMY  Patient Location: PACU  Anesthesia Type:General  Level of Consciousness: awake, alert , oriented and patient cooperative  Airway & Oxygen Therapy: Patient Spontanous Breathing  Post-op Assessment: Report given to RN, Post -op Vital signs reviewed and stable and Patient moving all extremities X 4  Post vital signs: Reviewed and stable  Last Vitals:  Vitals Value Taken Time  BP 98/72 09/07/20 0841  Temp 36.6 C 09/07/20 0841  Pulse 57 09/07/20 0841  Resp 21 09/07/20 0841  SpO2 100 % 09/07/20 0841    Last Pain:  Vitals:   09/07/20 0841  TempSrc: Oral  PainSc:          Complications: No complications documented.

## 2020-09-07 NOTE — Op Note (Signed)
[facility] Patient Name: Stephen Hamilton Procedure Date: 09/07/2020 8:06 AM MRN: [id number] Date of Birth: 09/02/1951 Attending MD: Elon Alas. Abbey Chatters DO CSN: [id number] Age: 69 Admit Type: Outpatient Procedure:                Colonoscopy Indications:              Rectal bleeding Providers:                Elon Alas. Abbey Chatters, DO, Janeece Riggers, RN, Randa Spike, Technician Referring MD:              Medicines:                See the Anesthesia note for documentation of the                            administered medications Complications:            No immediate complications. Estimated Blood Loss:     Estimated blood loss was minimal. Procedure:                Pre-Anesthesia Assessment:                           - The anesthesia plan was to use monitored                            anesthesia care (MAC).                           After obtaining informed consent, the colonoscope                            was passed under direct vision. Throughout the                            procedure, the patient's blood pressure, pulse, and                            oxygen saturations were monitored continuously. The                            [id number]-HQ190L ([id number]) scope was introduced through                            the anus and advanced to the the cecum, identified                            by appendiceal orifice and ileocecal valve. The                            colonoscopy was technically difficult and complex                            due to significant looping and a tortuous colon.  The patient tolerated the procedure well. The                            quality of the bowel preparation was evaluated                            using the BBPS [facility] Bowel Preparation Scale)                            with scores of: Right Colon = 2 (minor amount of                            residual staining, small fragments of stool and/or                             opaque liquid, but mucosa seen well), Transverse                            Colon = 2 (minor amount of residual staining, small                            fragments of stool and/or opaque liquid, but mucosa                            seen well) and Left Colon = 2 (minor amount of                            residual staining, small fragments of stool and/or                            opaque liquid, but mucosa seen well). The total                            BBPS score equals 6. The quality of the bowel                            preparation was fair. Scope In: 8:23:16 AM Scope Out: 8:35:41 AM Scope Withdrawal Time: 0 hours 6 minutes 36 seconds  Total Procedure Duration: 0 hours 12 minutes 25 seconds  Findings:      Skin tags were found on perianal exam.      Non-bleeding internal hemorrhoids were found during retroflexion.      Multiple small-mouthed diverticula were found in the sigmoid colon and       descending colon.      A 5 mm polyp was found in the transverse colon. The polyp was sessile.       The polyp was removed with a cold snare. Resection and retrieval were       complete.      The exam was otherwise without abnormality. Impression:               - Preparation of the colon was fair.                           -  Perianal skin tags found on perianal exam.                           - Non-bleeding internal hemorrhoids.                           - Diverticulosis in the sigmoid colon and in the                            descending colon.                           - One 5 mm polyp in the transverse colon, removed                            with a cold snare. Resected and retrieved.                           - The examination was otherwise normal. Moderate Sedation:      Per Anesthesia Care Recommendation:           - Patient has a contact number available for                            emergencies. The signs and symptoms of potential                             delayed complications were discussed with the                            patient. Return to normal activities tomorrow.                            Written discharge instructions were provided to the                            patient.                           - Resume previous diet.                           - Continue present medications.                           - Await pathology results.                           - Repeat colonoscopy in 5 years for surveillance.                           - Return to GI clinic at the next available                            appointment with Roseanne Kaufman for hemorrhoid banding. Procedure Code(s):        --- Professional ---  45385, Colonoscopy, flexible; with removal of                            tumor(s), polyp(s), or other lesion(s) by snare                            technique Diagnosis Code(s):        --- Professional ---                           K64.8, Other hemorrhoids                           K63.5, Polyp of colon                           K64.4, Residual hemorrhoidal skin tags                           K62.5, Hemorrhage of anus and rectum                           K57.30, Diverticulosis of large intestine without                            perforation or abscess without bleeding CPT copyright 2019 American Medical Association. All rights reserved. The codes documented in this report are preliminary and upon coder review may  be revised to meet current compliance requirements. Elon Alas. Abbey Chatters, DO [place] Abbey Chatters, DO 09/07/2020 8:40:23 AM This report has been signed electronically. Number of Addenda: 0

## 2020-09-07 NOTE — Anesthesia Postprocedure Evaluation (Signed)
Anesthesia Post Note  Patient: Stephen Hamilton  Procedure(s) Performed: COLONOSCOPY WITH PROPOFOL (N/A ) POLYPECTOMY  Patient location during evaluation: Phase II Anesthesia Type: General Level of consciousness: awake, oriented, awake and alert and patient cooperative Pain management: satisfactory to patient Vital Signs Assessment: post-procedure vital signs reviewed and stable Respiratory status: spontaneous breathing, nonlabored ventilation and respiratory function stable Anesthetic complications: no   No complications documented.   Last Vitals:  Vitals:   09/07/20 0737 09/07/20 0841  BP: (!) 145/86 98/72  Pulse: 63 (!) 57  Resp: (!) 21 (!) 21  Temp: 36.8 C 36.6 C  SpO2: 97% 100%    Last Pain:  Vitals:   09/07/20 0841  TempSrc: Oral  PainSc: 0-No pain                 Miquel Lamson

## 2020-09-07 NOTE — Anesthesia Preprocedure Evaluation (Addendum)
Anesthesia Evaluation  Patient identified by MRN, date of birth, ID band Patient awake    Reviewed: Allergy & Precautions, NPO status , Patient's Chart, lab work & pertinent test results, reviewed documented beta blocker date and time   History of Anesthesia Complications Negative for: history of anesthetic complications  Airway Mallampati: III  TM Distance: >3 FB Neck ROM: Full    Dental  (+) Dental Advisory Given, Teeth Intact   Pulmonary Current SmokerPatient did not abstain from smoking.,    Pulmonary exam normal breath sounds clear to auscultation       Cardiovascular Exercise Tolerance: Good hypertension, Pt. on medications and Pt. on home beta blockers Normal cardiovascular exam Rhythm:Regular Rate:Normal     Neuro/Psych negative neurological ROS  negative psych ROS   GI/Hepatic Neg liver ROS, GERD  Medicated,  Endo/Other  negative endocrine ROS  Renal/GU Renal disease (stones)  negative genitourinary   Musculoskeletal negative musculoskeletal ROS (+)   Abdominal   Peds negative pediatric ROS (+)  Hematology negative hematology ROS (+)   Anesthesia Other Findings   Reproductive/Obstetrics negative OB ROS                            Anesthesia Physical Anesthesia Plan  ASA: II  Anesthesia Plan: General   Post-op Pain Management:    Induction: Intravenous  PONV Risk Score and Plan:   Airway Management Planned: Nasal Cannula and Natural Airway  Additional Equipment:   Intra-op Plan:   Post-operative Plan:   Informed Consent: I have reviewed the patients History and Physical, chart, labs and discussed the procedure including the risks, benefits and alternatives for the proposed anesthesia with the patient or authorized representative who has indicated his/her understanding and acceptance.     Dental advisory given  Plan Discussed with: CRNA  Anesthesia Plan  Comments:         Anesthesia Quick Evaluation

## 2020-09-07 NOTE — Discharge Instructions (Addendum)
Colonoscopy Discharge Instructions  Read the instructions outlined below and refer to this sheet in the next few weeks. These discharge instructions provide you with general information on caring for yourself after you leave the hospital. Your doctor may also give you specific instructions. While your treatment has been planned according to the most current medical practices available, unavoidable complications occasionally occur.   ACTIVITY  You may resume your regular activity, but move at a slower pace for the next 24 hours.   Take frequent rest periods for the next 24 hours.   Walking will help get rid of the air and reduce the bloated feeling in your belly (abdomen).   No driving for 24 hours (because of the medicine (anesthesia) used during the test).    Do not sign any important legal documents or operate any machinery for 24 hours (because of the anesthesia used during the test).  NUTRITION  Drink plenty of fluids.   You may resume your normal diet as instructed by your doctor.   Begin with a light meal and progress to your normal diet. Heavy or fried foods are harder to digest and may make you feel sick to your stomach (nauseated).   Avoid alcoholic beverages for 24 hours or as instructed.  MEDICATIONS  You may resume your normal medications unless your doctor tells you otherwise.  WHAT YOU CAN EXPECT TODAY  Some feelings of bloating in the abdomen.   Passage of more gas than usual.   Spotting of blood in your stool or on the toilet paper.  IF YOU HAD POLYPS REMOVED DURING THE COLONOSCOPY:  No aspirin products for 7 days or as instructed.   No alcohol for 7 days or as instructed.   Eat a soft diet for the next 24 hours.  FINDING OUT THE RESULTS OF YOUR TEST Not all test results are available during your visit. If your test results are not back during the visit, make an appointment with your caregiver to find out the results. Do not assume everything is normal if  you have not heard from your caregiver or the medical facility. It is important for you to follow up on all of your test results.  SEEK IMMEDIATE MEDICAL ATTENTION IF:  You have more than a spotting of blood in your stool.   Your belly is swollen (abdominal distention).   You are nauseated or vomiting.   You have a temperature over 101.   You have abdominal pain or discomfort that is severe or gets worse throughout the day.    Colon Polyps  Polyps are tissue growths inside the body. Polyps can grow in many places, including the large intestine (colon). A polyp may be a round bump or a mushroom-shaped growth. You could have one polyp or several. Most colon polyps are noncancerous (benign). However, some colon polyps can become cancerous over time. Finding and removing the polyps early can help prevent this. What are the causes? The exact cause of colon polyps is not known. What increases the risk? You are more likely to develop this condition if you:  Have a family history of colon cancer or colon polyps.  Are older than 31 or older than 45 if you are African American.  Have inflammatory bowel disease, such as ulcerative colitis or Crohn's disease.  Have certain hereditary conditions, such as: ? Familial adenomatous polyposis. ? Lynch syndrome. ? Turcot syndrome. ? Peutz-Jeghers syndrome.  Are overweight.  Smoke cigarettes.  Do not get enough exercise.  Drink  too much alcohol.  Eat a diet that is high in fat and red meat and low in fiber.  Had childhood cancer that was treated with abdominal radiation. What are the signs or symptoms? Most polyps do not cause symptoms. If you have symptoms, they may include:  Blood coming from your rectum when having a bowel movement.  Blood in your stool. The stool may look dark red or black.  Abdominal pain.  A change in bowel habits, such as constipation or diarrhea. How is this diagnosed? This condition is diagnosed with a  colonoscopy. This is a procedure in which a lighted, flexible scope is inserted into the anus and then passed into the colon to examine the area. Polyps are sometimes found when a colonoscopy is done as part of routine cancer screening tests. How is this treated? Treatment for this condition involves removing any polyps that are found. Most polyps can be removed during a colonoscopy. Those polyps will then be tested for cancer. Additional treatment may be needed depending on the results of testing. Follow these instructions at home: Lifestyle  Maintain a healthy weight, or lose weight if recommended by your health care provider.  Exercise every day or as told by your health care provider.  Do not use any products that contain nicotine or tobacco, such as cigarettes and e-cigarettes. If you need help quitting, ask your health care provider.  If you drink alcohol, limit how much you have: ? 0-1 drink a day for women. ? 0-2 drinks a day for men.  Be aware of how much alcohol is in your drink. In the [place] one drink equals one 12 oz bottle of beer (355 mL), one 5 oz glass of wine (148 mL), or one 1 oz shot of hard liquor (44 mL). Eating and drinking   Eat foods that are high in fiber, such as fruits, vegetables, and whole grains.  Eat foods that are high in calcium and vitamin D, such as milk, cheese, yogurt, eggs, liver, fish, and broccoli.  Limit foods that are high in fat, such as fried foods and desserts.  Limit the amount of red meat and processed meat you eat, such as hot dogs, sausage, bacon, and lunch meats. General instructions  Keep all follow-up visits as told by your health care provider. This is important. ? This includes having regularly scheduled colonoscopies. ? Talk to your health care provider about when you need a colonoscopy. Contact a health care provider if:  You have new or worsening bleeding during a bowel movement.  You have new or increased blood in your  stool.  You have a change in bowel habits.  You lose weight for no known reason. Summary  Polyps are tissue growths inside the body. Polyps can grow in many places, including the colon.  Most colon polyps are noncancerous (benign), but some can become cancerous over time.  This condition is diagnosed with a colonoscopy.  Treatment for this condition involves removing any polyps that are found. Most polyps can be removed during a colonoscopy. This information is not intended to replace advice given to you by your health care provider. Make sure you discuss any questions you have with your health care provider. Document Revised: 03/01/2018 Document Reviewed: 03/01/2018 Elsevier Patient Education  [place].   Hemorrhoids Hemorrhoids are swollen veins that may develop:  In the butt (rectum). These are called internal hemorrhoids.  Around the opening of the butt (anus). These are called external hemorrhoids. Hemorrhoids can  cause pain, itching, or bleeding. Most of the time, they do not cause serious problems. They usually get better with diet changes, lifestyle changes, and other home treatments. What are the causes? This condition may be caused by:  Having trouble pooping (constipation).  Pushing hard (straining) to poop.  Watery poop (diarrhea).  Pregnancy.  Being very overweight (obese).  Sitting for long periods of time.  Heavy lifting or other activity that causes you to strain.  Anal sex.  Riding a bike for a long period of time. What are the signs or symptoms? Symptoms of this condition include:  Pain.  Itching or soreness in the butt.  Bleeding from the butt.  Leaking poop.  Swelling in the area.  One or more lumps around the opening of your butt. How is this diagnosed? A doctor can often diagnose this condition by looking at the affected area. The doctor may also:  Do an exam that involves feeling the area with a gloved hand (digital  rectal exam).  Examine the area inside your butt using a small tube (anoscope).  Order blood tests. This may be done if you have lost a lot of blood.  Have you get a test that involves looking inside the colon using a flexible tube with a camera on the end (sigmoidoscopy or colonoscopy). How is this treated? This condition can usually be treated at home. Your doctor may tell you to change what you eat, make lifestyle changes, or try home treatments. If these do not help, procedures can be done to remove the hemorrhoids or make them smaller. These may involve:  Placing rubber bands at the base of the hemorrhoids to cut off their blood supply.  Injecting medicine into the hemorrhoids to shrink them.  Shining a type of light energy onto the hemorrhoids to cause them to fall off.  Doing surgery to remove the hemorrhoids or cut off their blood supply. Follow these instructions at home: Eating and drinking   Eat foods that have a lot of fiber in them. These include whole grains, beans, nuts, fruits, and vegetables.  Ask your doctor about taking products that have added fiber (fibersupplements).  Reduce the amount of fat in your diet. You can do this by: ? Eating low-fat dairy products. ? Eating less red meat. ? Avoiding processed foods.  Drink enough fluid to keep your pee (urine) pale yellow. Managing pain and swelling   Take a warm-water bath (sitz bath) for 20 minutes to ease pain. Do this 3-4 times a day. You may do this in a bathtub or using a portable sitz bath that fits over the toilet.  If told, put ice on the painful area. It may be helpful to use ice between your warm baths. ? Put ice in a plastic bag. ? Place a towel between your skin and the bag. ? Leave the ice on for 20 minutes, 2-3 times a day. General instructions  Take over-the-counter and prescription medicines only as told by your doctor. ? Medicated creams and medicines may be used as told.  Exercise often.  Ask your doctor how much and what kind of exercise is best for you.  Go to the bathroom when you have the urge to poop. Do not wait.  Avoid pushing too hard when you poop.  Keep your butt dry and clean. Use wet toilet paper or moist towelettes after pooping.  Do not sit on the toilet for a long time.  Keep all follow-up visits as told by  your doctor. This is important. Contact a doctor if you:  Have pain and swelling that do not get better with treatment or medicine.  Have trouble pooping.  Cannot poop.  Have pain or swelling outside the area of the hemorrhoids. Get help right away if you have:  Bleeding that will not stop. Summary  Hemorrhoids are swollen veins in the butt or around the opening of the butt.  They can cause pain, itching, or bleeding.  Eat foods that have a lot of fiber in them. These include whole grains, beans, nuts, fruits, and vegetables.  Take a warm-water bath (sitz bath) for 20 minutes to ease pain. Do this 3-4 times a day. This information is not intended to replace advice given to you by your health care provider. Make sure you discuss any questions you have with your health care provider. Document Revised: 11/22/2018 Document Reviewed: 04/05/2018 Elsevier Patient Education  [employer].   Your colonoscopy revealed one small polyp which I removed successfully.  Await pathology results, my office will contact you.  I recommend repeating colonoscopy for surveillance purposes in 5 years.  You also have internal hemorrhoids which is likely causing your bleeding.  We will set you up for hemorrhoid banding with Roseanne Kaufman in our clinic at her next available appointment.  I hope you have a great rest of your week!  Elon Alas. Abbey Chatters, D.O. Gastroenterology and Hepatology [facility]

## 2020-09-09 LAB — SURGICAL PATHOLOGY

## 2020-09-10 ENCOUNTER — Encounter (HOSPITAL_COMMUNITY): Payer: Self-pay | Admitting: Internal Medicine

## 2020-09-10 DIAGNOSIS — R69 Illness, unspecified: Secondary | ICD-10-CM | POA: Diagnosis not present

## 2020-09-16 DIAGNOSIS — R69 Illness, unspecified: Secondary | ICD-10-CM | POA: Diagnosis not present

## 2020-09-25 DIAGNOSIS — Z23 Encounter for immunization: Secondary | ICD-10-CM | POA: Diagnosis not present

## 2020-10-05 ENCOUNTER — Ambulatory Visit: Payer: Medicare HMO | Admitting: Podiatry

## 2020-10-05 ENCOUNTER — Other Ambulatory Visit: Payer: Self-pay

## 2020-10-05 DIAGNOSIS — M7662 Achilles tendinitis, left leg: Secondary | ICD-10-CM

## 2020-10-05 DIAGNOSIS — M722 Plantar fascial fibromatosis: Secondary | ICD-10-CM | POA: Diagnosis not present

## 2020-10-05 DIAGNOSIS — M7661 Achilles tendinitis, right leg: Secondary | ICD-10-CM

## 2020-10-05 DIAGNOSIS — M7731 Calcaneal spur, right foot: Secondary | ICD-10-CM | POA: Diagnosis not present

## 2020-10-05 DIAGNOSIS — M7732 Calcaneal spur, left foot: Secondary | ICD-10-CM

## 2020-10-05 NOTE — Progress Notes (Signed)
   HPI: 69 y.o. male presenting today for follow-up evaluation of Achilles tendinitis to the bilateral ankles as well as chronic plantar fasciitis.  Patient states that the physical therapy and anti-inflammatories only helped temporarily.  He states that the pain is the same today as it has been since prior to all the conservative modalities.  He continues to have achiness and pain to the plantar and posterior aspects of the heels.  He presents for further treatment evaluation  Past Medical History:  Diagnosis Date  . BPH (benign prostatic hyperplasia)   . GERD (gastroesophageal reflux disease)   . HTN (hypertension)   . Kidney stone       Physical Exam: General: The patient is alert and oriented x3 in no acute distress.  Dermatology: Skin is warm, dry and supple bilateral lower extremities. Negative for open lesions or macerations.  Vascular: Palpable pedal pulses bilaterally. No edema or erythema noted. Capillary refill within normal limits.  Neurological: Epicritic and protective threshold grossly intact bilaterally.   Musculoskeletal Exam: Recurrent pain on palpation noted to the posterior tubercle of the bilateral calcaneus at the insertion of the Achilles tendon consistent with retrocalcaneal bursitis. Range of motion within normal limits. Muscle strength 5/5 in all muscle groups bilateral lower extremities.  Pain on palpation also noted to the medial calcaneal tubercle of the bilateral heels right greater than the left.   Assessment: 1. Insertional Achilles tendinitis bilateral 2.  Posterior heel spur bilateral  3.  Retrocalcaneal bursitis 4.  Plantar fasciitis bilateral   Plan of Care:  1. Patient was evaluated.  X-rays taken prior were reviewed again today. 2. Today we discussed the conservative versus surgical management of the presenting pathology. The patient opts for surgical management. All possible complications and details of the procedure were explained. All  patient questions were answered. No guarantees were expressed or implied. 3. Authorization for surgery was initiated today. Surgery will consist of endoscopic plantar fasciotomy right.  Retrocalcaneal exostectomy right.  Repair of Achilles tendon right. 4.  Return to clinic 1 week postop   *Likes to play golf.  Retired.  The patient would like to have surgery soon as possible to the right lower extremity during the winter months and consider having left foot surgery the following winter   Edrick Kins, DPM [facility]  Dr. Edrick Kins, [place]                                        [place],  [address]                Office [phone]  Fax [phone]

## 2020-10-08 DIAGNOSIS — R69 Illness, unspecified: Secondary | ICD-10-CM | POA: Diagnosis not present

## 2020-10-08 DIAGNOSIS — Z0000 Encounter for general adult medical examination without abnormal findings: Secondary | ICD-10-CM | POA: Diagnosis not present

## 2020-10-08 DIAGNOSIS — I10 Essential (primary) hypertension: Secondary | ICD-10-CM | POA: Diagnosis not present

## 2020-10-08 DIAGNOSIS — Z6835 Body mass index (BMI) 35.0-35.9, adult: Secondary | ICD-10-CM | POA: Diagnosis not present

## 2020-10-08 DIAGNOSIS — Z299 Encounter for prophylactic measures, unspecified: Secondary | ICD-10-CM | POA: Diagnosis not present

## 2020-10-08 DIAGNOSIS — Z1339 Encounter for screening examination for other mental health and behavioral disorders: Secondary | ICD-10-CM | POA: Diagnosis not present

## 2020-10-08 DIAGNOSIS — Z7189 Other specified counseling: Secondary | ICD-10-CM | POA: Diagnosis not present

## 2020-10-08 DIAGNOSIS — Z1331 Encounter for screening for depression: Secondary | ICD-10-CM | POA: Diagnosis not present

## 2020-10-09 ENCOUNTER — Telehealth: Payer: Self-pay | Admitting: Internal Medicine

## 2020-10-09 DIAGNOSIS — Z0000 Encounter for general adult medical examination without abnormal findings: Secondary | ICD-10-CM | POA: Diagnosis not present

## 2020-10-09 DIAGNOSIS — Z79899 Other long term (current) drug therapy: Secondary | ICD-10-CM | POA: Diagnosis not present

## 2020-10-09 DIAGNOSIS — Z125 Encounter for screening for malignant neoplasm of prostate: Secondary | ICD-10-CM | POA: Diagnosis not present

## 2020-10-09 DIAGNOSIS — R5383 Other fatigue: Secondary | ICD-10-CM | POA: Diagnosis not present

## 2020-10-09 NOTE — Telephone Encounter (Signed)
Please call patient when you can, he called questioning why he received a bill for 200$ for his tcs.  His insurance told him he should not have received anything because they cover preventative tests.  I asked him if they removed anything and he said yes a polyp.  I tried to explain to him that changes it from a screening and then he wanted to know if his insurance will cover his upcoming banding in the office.  He also said that on his bill it stated to call the physicians office that the bill came from.

## 2020-10-12 NOTE — Telephone Encounter (Signed)
I spoke with the patient and made him aware we were unable to change codes.  Also there are no guarantees until a claim has been submitted.

## 2020-10-21 ENCOUNTER — Ambulatory Visit: Payer: Medicare HMO | Admitting: Gastroenterology

## 2020-10-21 ENCOUNTER — Telehealth: Payer: Self-pay

## 2020-10-21 NOTE — Telephone Encounter (Signed)
DOS 11/05/2020  EPF RT - 71994 CALCANEAL OSTECTOMY RT - 12904 REPAIR ACHILLES RT - 75339   AETNA MEDICARE EFFECTIVE DATE - 11/29/2019  PLAN DEDUCTIBLE - $0.00 OUT OF POCKET - $5000.00 W/ $4404.77 REMAINING COPAY $200.00 COINSURANCE - 100%  PER AUTOMATED SYSTEM, NO PRECERT REQUIRED FOR CPT [phone], [address] OR [address]. CALL REF # [id number]

## 2020-11-04 ENCOUNTER — Other Ambulatory Visit: Payer: Self-pay | Admitting: Podiatry

## 2020-11-04 DIAGNOSIS — M7661 Achilles tendinitis, right leg: Secondary | ICD-10-CM

## 2020-11-04 DIAGNOSIS — M7732 Calcaneal spur, left foot: Secondary | ICD-10-CM

## 2020-11-04 DIAGNOSIS — M722 Plantar fascial fibromatosis: Secondary | ICD-10-CM

## 2020-11-04 DIAGNOSIS — M7731 Calcaneal spur, right foot: Secondary | ICD-10-CM

## 2020-11-04 NOTE — Progress Notes (Signed)
PRN postop 

## 2020-11-11 ENCOUNTER — Encounter: Payer: Medicare HMO | Admitting: Podiatry

## 2020-11-23 ENCOUNTER — Ambulatory Visit: Payer: Medicare HMO | Admitting: Podiatry

## 2020-11-23 ENCOUNTER — Encounter: Payer: Medicare HMO | Admitting: Podiatry

## 2020-12-02 ENCOUNTER — Ambulatory Visit (INDEPENDENT_AMBULATORY_CARE_PROVIDER_SITE_OTHER): Payer: 59 | Admitting: Podiatry

## 2020-12-02 ENCOUNTER — Other Ambulatory Visit: Payer: Self-pay

## 2020-12-02 DIAGNOSIS — M7731 Calcaneal spur, right foot: Secondary | ICD-10-CM

## 2020-12-02 DIAGNOSIS — M7661 Achilles tendinitis, right leg: Secondary | ICD-10-CM

## 2020-12-02 DIAGNOSIS — M722 Plantar fascial fibromatosis: Secondary | ICD-10-CM | POA: Diagnosis not present

## 2020-12-02 DIAGNOSIS — M7662 Achilles tendinitis, left leg: Secondary | ICD-10-CM | POA: Diagnosis not present

## 2020-12-02 DIAGNOSIS — M7732 Calcaneal spur, left foot: Secondary | ICD-10-CM | POA: Diagnosis not present

## 2020-12-03 ENCOUNTER — Other Ambulatory Visit: Payer: Self-pay | Admitting: Podiatry

## 2020-12-06 NOTE — Telephone Encounter (Signed)
Please advise 

## 2020-12-07 ENCOUNTER — Encounter: Payer: Medicare HMO | Admitting: Podiatry

## 2020-12-10 ENCOUNTER — Encounter: Payer: Self-pay | Admitting: Gastroenterology

## 2020-12-10 ENCOUNTER — Other Ambulatory Visit: Payer: Self-pay

## 2020-12-10 ENCOUNTER — Ambulatory Visit (INDEPENDENT_AMBULATORY_CARE_PROVIDER_SITE_OTHER): Payer: Medicare HMO | Admitting: Gastroenterology

## 2020-12-10 VITALS — BP 143/86 | HR 63 | Temp 97.5°F | Ht 70.0 in | Wt 261.0 lb

## 2020-12-10 DIAGNOSIS — K642 Third degree hemorrhoids: Secondary | ICD-10-CM | POA: Diagnosis not present

## 2020-12-10 NOTE — Patient Instructions (Signed)
Please call with any concerns .   Avoid straining, limit toilet time to 2-3 minutes.   We can see you back in 2-3 weeks for further banding!  It was a pleasure to see you today. I want to create trusting relationships with patients to provide genuine, compassionate, and quality care. I value your feedback. If you receive a survey regarding your visit,  I greatly appreciate you taking time to fill this out.   Annitta Needs, PhD, ANP-BC [facility]enter[facility]

## 2020-12-10 NOTE — Progress Notes (Signed)
[facility] Banding Note:   Pleasant 70 year old male presenting today with symptomatic hemorrhoids. Recent colonoscopy with skin tags perianal exam. Non-bleeding internal hemorrhoids. Multiple small-mouthed diverticula in sigmoid and descending colon. 5 mm polyp in transverse colon. Polyp benign. Surveillance in 5 years. Notes itching, bleeding, pressure. NO anticoagulation, history of liver disease, or known thrombocytopenia.   The patient presents with symptomatic grade 2-3 hemorrhoids, unresponsive to maximal medical therapy, requesting rubber band ligation of hishemorrhoidal disease. All risks, benefits, and alternative forms of therapy were described and informed consent was obtained.  The decision was made to band the left lateral internal hemorrhoid, and the [place] was used to perform band ligation without complication. He had tremendous difficulty relaxing anal sphincter for the ligator. I eventually had to use small dose of lidocaine ointment perirectally and had him bear down while inserting the device, which helped to distract and allow for easy insertion. Digital anorectal examination was then performed to assure proper positioning of the band, and to adjust the banded tissue as required. The patient was discharged home without pain or other issues. Dietary and behavioral recommendations were given and (if necessary prescriptions were given), along with follow-up instructions. The patient will return in 2-3 weeks for followup and possible additional banding as required.  No complications were encountered and the patient tolerated the procedure well.   Annitta Needs, PhD, ANP-BC [facility]

## 2020-12-20 NOTE — Progress Notes (Signed)
   HPI: 70 y.o. male presenting today for follow-up evaluation of Achilles tendinitis to the bilateral ankles as well as chronic plantar fasciitis.  Patient has tried multiple conservative modalities including anti-inflammatories and physical therapy which helped temporarily.  Last visit on 10/05/2020 surgery was arranged.  The patient misunderstood some of the preoperative protocols including n.p.o. prior to surgery.  Ultimately surgery was canceled and he presents today for follow-up and to discuss his treatment options  Past Medical History:  Diagnosis Date  . BPH (benign prostatic hyperplasia)   . GERD (gastroesophageal reflux disease)   . HTN (hypertension)   . Kidney stone       Physical Exam: General: The patient is alert and oriented x3 in no acute distress.  Dermatology: Skin is warm, dry and supple bilateral lower extremities. Negative for open lesions or macerations.  Vascular: Palpable pedal pulses bilaterally. No edema or erythema noted. Capillary refill within normal limits.  Neurological: Epicritic and protective threshold grossly intact bilaterally.   Musculoskeletal Exam: Recurrent pain on palpation noted to the posterior tubercle of the bilateral calcaneus at the insertion of the Achilles tendon consistent with retrocalcaneal bursitis. Range of motion within normal limits. Muscle strength 5/5 in all muscle groups bilateral lower extremities.  Pain on palpation also noted to the medial calcaneal tubercle of the bilateral heels right greater than the left.   Assessment: 1. Insertional Achilles tendinitis bilateral 2.  Posterior heel spur bilateral  3.  Retrocalcaneal bursitis 4.  Plantar fasciitis bilateral   Plan of Care:  1. Patient was evaluated.  2.  After discussing surgery detail again the patient was hesitant to proceed with surgery.  At the moment we will not undergo or pursue surgery as discussed last visit and pursue more conservative treatment options 3.   Power step insoles were provided today 4.  Felt heel pads were also dispensed.  Patient states that when his heels are lifted he does not experience as much pain 5.  Return to clinic in 3 weeks  *Likes to play golf.  Retired.  The patient would like to have surgery soon as possible to the right lower extremity during the winter months and consider having left foot surgery the following winter   Edrick Kins, DPM [facility]  Dr. Edrick Kins, [place]                                        [place], [place] [address]                Office [phone]  Fax [phone]

## 2020-12-24 ENCOUNTER — Encounter (INDEPENDENT_AMBULATORY_CARE_PROVIDER_SITE_OTHER): Payer: Self-pay

## 2020-12-28 DIAGNOSIS — N529 Male erectile dysfunction, unspecified: Secondary | ICD-10-CM | POA: Diagnosis not present

## 2020-12-28 DIAGNOSIS — K219 Gastro-esophageal reflux disease without esophagitis: Secondary | ICD-10-CM | POA: Diagnosis not present

## 2020-12-28 DIAGNOSIS — I10 Essential (primary) hypertension: Secondary | ICD-10-CM | POA: Diagnosis not present

## 2020-12-28 DIAGNOSIS — N400 Enlarged prostate without lower urinary tract symptoms: Secondary | ICD-10-CM | POA: Diagnosis not present

## 2020-12-30 ENCOUNTER — Ambulatory Visit (INDEPENDENT_AMBULATORY_CARE_PROVIDER_SITE_OTHER): Payer: Medicare HMO | Admitting: Podiatry

## 2020-12-30 ENCOUNTER — Other Ambulatory Visit: Payer: Self-pay

## 2020-12-30 DIAGNOSIS — M7661 Achilles tendinitis, right leg: Secondary | ICD-10-CM | POA: Diagnosis not present

## 2020-12-30 DIAGNOSIS — M7662 Achilles tendinitis, left leg: Secondary | ICD-10-CM | POA: Diagnosis not present

## 2020-12-30 DIAGNOSIS — M722 Plantar fascial fibromatosis: Secondary | ICD-10-CM

## 2020-12-30 DIAGNOSIS — M7732 Calcaneal spur, left foot: Secondary | ICD-10-CM

## 2020-12-30 DIAGNOSIS — M7731 Calcaneal spur, right foot: Secondary | ICD-10-CM | POA: Diagnosis not present

## 2020-12-30 NOTE — Progress Notes (Signed)
   HPI: 70 y.o. male presenting for follow-up evaluation of Achilles tendinitis to the bilateral ankles as well as chronic plantar fasciitis. He states that he is doing very well. Shoes that he is wearing with the insoles are helping significantly. He presents for further treatment evaluation  Past Medical History:  Diagnosis Date  . BPH (benign prostatic hyperplasia)   . GERD (gastroesophageal reflux disease)   . HTN (hypertension)   . Kidney stone       Physical Exam: General: The patient is alert and oriented x3 in no acute distress.  Dermatology: Skin is warm, dry and supple bilateral lower extremities. Negative for open lesions or macerations.  Vascular: Palpable pedal pulses bilaterally. No edema or erythema noted. Capillary refill within normal limits.  Neurological: Epicritic and protective threshold grossly intact bilaterally.   Musculoskeletal Exam: Recurrent pain on palpation noted to the posterior tubercle of the bilateral calcaneus at the insertion of the Achilles tendon consistent with retrocalcaneal bursitis. Range of motion within normal limits. Muscle strength 5/5 in all muscle groups bilateral lower extremities.  Pain on palpation also noted to the medial calcaneal tubercle of the bilateral heels right greater than the left.   Assessment: 1. Insertional Achilles tendinitis bilateral 2.  Posterior heel spur bilateral  3.  Retrocalcaneal bursitis 4.  Plantar fasciitis bilateral   Plan of Care:  1. Patient was evaluated.  2. Overall the patient is doing very well with minimal to no pain. He states that the shoes as well as the insoles and heel pads are helping significantly. 3. Continue OTC power step insoles 4. Continue felt heel pads.  Patient states that when his heels are lifted he does not experience as much pain 5. Patient is going to play golf at [facility] 01/22/2021 for 3 days. He states that if he has pain after playing golf he will give me a call and  discuss further treatment options  *Likes to play golf.  Retired.  The patient would like to have surgery soon as possible to the right lower extremity during the winter months and consider having left foot surgery the following winter   Edrick Kins, DPM [facility]  Dr. Edrick Kins, [address]                                        [place], [place] [address]                Office [phone]  Fax [phone]

## 2021-01-20 ENCOUNTER — Other Ambulatory Visit: Payer: Self-pay

## 2021-01-20 ENCOUNTER — Encounter: Payer: Self-pay | Admitting: Gastroenterology

## 2021-01-20 ENCOUNTER — Ambulatory Visit: Payer: Medicare HMO | Admitting: Gastroenterology

## 2021-01-20 DIAGNOSIS — K641 Second degree hemorrhoids: Secondary | ICD-10-CM

## 2021-01-20 NOTE — Patient Instructions (Signed)
Continue to avoid straining.   Limit toilet time to 2-3 minutes.  Avoid constipation.  We will see you in several weeks for banding!  I enjoyed seeing you again today! As you know, I value our relationship and want to provide genuine, compassionate, and quality care. I welcome your feedback. If you receive a survey regarding your visit,  I greatly appreciate you taking time to fill this out. See you next time!  Annitta Needs, PhD, ANP-BC [facility]enter[facility]

## 2021-01-20 NOTE — Progress Notes (Signed)
[facility] Banding Note:   Pleasant 70 year old male presenting today with symptomatic hemorrhoids. Recent colonoscopy with skin tags perianal exam. Non-bleeding internal hemorrhoids. Multiple small-mouthed diverticula in sigmoid and descending colon. 5 mm polyp in transverse colon. Polyp benign. Surveillance in 5 years. Notes itching, bleeding, pressure. NO anticoagulation, history of liver disease, or known thrombocytopenia. Previously underwent banding of left lateral internal hemorrhoid. Some improvement noted since last visit. Still with bleeding intermittently.    The patient presents with symptomatic grade 2 hemorrhoids, unresponsive to maximal medical therapy, requesting rubber band ligation of his hemorrhoidal disease. All risks, benefits, and alternative forms of therapy were described and informed consent was obtained.   The decision was made to band the right posterior internal hemorrhoid, and the [place] was used to perform band ligation without complication. Digital anorectal examination was then performed to assure proper positioning of the band, and to adjust the banded tissue as required. The patient was discharged home without pain or other issues. Dietary and behavioral recommendations were given and (if necessary prescriptions were given), along with follow-up instructions. The patient will return in 2-3 weeks for followup and possible additional banding as required.  No complications were encountered and the patient tolerated the procedure well.   Annitta Needs, PhD, ANP-BC [facility]astroenter[facility]

## 2021-01-21 DIAGNOSIS — I10 Essential (primary) hypertension: Secondary | ICD-10-CM | POA: Diagnosis not present

## 2021-01-21 DIAGNOSIS — Z299 Encounter for prophylactic measures, unspecified: Secondary | ICD-10-CM | POA: Diagnosis not present

## 2021-01-21 DIAGNOSIS — N400 Enlarged prostate without lower urinary tract symptoms: Secondary | ICD-10-CM | POA: Diagnosis not present

## 2021-02-26 ENCOUNTER — Other Ambulatory Visit: Payer: Self-pay

## 2021-02-26 ENCOUNTER — Encounter: Payer: Self-pay | Admitting: Gastroenterology

## 2021-02-26 ENCOUNTER — Ambulatory Visit: Payer: Medicare HMO | Admitting: Gastroenterology

## 2021-02-26 VITALS — BP 147/82 | HR 72 | Temp 97.5°F | Ht 70.0 in | Wt 260.6 lb

## 2021-02-26 DIAGNOSIS — K642 Third degree hemorrhoids: Secondary | ICD-10-CM

## 2021-02-26 NOTE — Progress Notes (Signed)
[facility] Banding Note:   Pleasant 70 year old male presenting today with symptomatic hemorrhoids. Recent colonoscopy with skin tags perianal exam. Non-bleeding internal hemorrhoids. Multiple small-mouthed diverticula in sigmoid and descending colon. 5 mm polyp in transverse colon. Polyp benign. Surveillance in 5 years. Has undergone banding of left lateral and right posterior. Still with prolapsing tissue at times and small amount bleeding.    The patient presents with symptomatic grade3 hemorrhoids, unresponsive to maximal medical therapy, requesting rubber band ligation of hishemorrhoidal disease. All risks, benefits, and alternative forms of therapy were described and informed consent was obtained.   The decision was made to band the right anterior internal hemorrhoid, and the [place] was used to perform band ligation. However, ligator lost suction (device malfunction) and a second ligator was used. This ligator was without complication. As of note, patient very tense and difficult to advance ligator due to anxiety. Digital anorectal examination was then performed to assure proper positioning of the band, and to adjust the banded tissue as required. The patient was discharged home without pain or other issues. Dietary and behavioral recommendations were given and (if necessary prescriptions were given), along with follow-up instructions. The patient will return as needed for followup and possible additional banding as required. I suspect he may need additional banding due to larger hemorrhoids. Would pursue neutral banding in future if he desires.   No complications were encountered and the patient tolerated the procedure well.  Annitta Needs, PhD, ANP-BC [facility]

## 2021-02-26 NOTE — Patient Instructions (Signed)
Please call if you continue to have symptoms! You may need an additional banding as hemorrhoids are larger.  We will see you back as needed!  I enjoyed seeing you again today! As you know, I value our relationship and want to provide genuine, compassionate, and quality care. I welcome your feedback. If you receive a survey regarding your visit,  I greatly appreciate you taking time to fill this out. See you next time!  Annitta Needs, PhD, ANP-BC [facility] Gastroenterology

## 2021-03-02 DIAGNOSIS — M1990 Unspecified osteoarthritis, unspecified site: Secondary | ICD-10-CM | POA: Diagnosis not present

## 2021-03-02 DIAGNOSIS — G8929 Other chronic pain: Secondary | ICD-10-CM | POA: Diagnosis not present

## 2021-03-02 DIAGNOSIS — I10 Essential (primary) hypertension: Secondary | ICD-10-CM | POA: Diagnosis not present

## 2021-03-02 DIAGNOSIS — N400 Enlarged prostate without lower urinary tract symptoms: Secondary | ICD-10-CM | POA: Diagnosis not present

## 2021-03-02 DIAGNOSIS — R69 Illness, unspecified: Secondary | ICD-10-CM | POA: Diagnosis not present

## 2021-03-02 DIAGNOSIS — N529 Male erectile dysfunction, unspecified: Secondary | ICD-10-CM | POA: Diagnosis not present

## 2021-03-02 DIAGNOSIS — K219 Gastro-esophageal reflux disease without esophagitis: Secondary | ICD-10-CM | POA: Diagnosis not present

## 2021-03-02 DIAGNOSIS — Z008 Encounter for other general examination: Secondary | ICD-10-CM | POA: Diagnosis not present

## 2021-03-16 ENCOUNTER — Ambulatory Visit (INDEPENDENT_AMBULATORY_CARE_PROVIDER_SITE_OTHER): Payer: Medicare HMO | Admitting: Urology

## 2021-03-16 ENCOUNTER — Other Ambulatory Visit: Payer: Self-pay

## 2021-03-16 ENCOUNTER — Encounter: Payer: Self-pay | Admitting: Urology

## 2021-03-16 DIAGNOSIS — N5201 Erectile dysfunction due to arterial insufficiency: Secondary | ICD-10-CM

## 2021-03-16 DIAGNOSIS — R350 Frequency of micturition: Secondary | ICD-10-CM

## 2021-03-16 DIAGNOSIS — R3915 Urgency of urination: Secondary | ICD-10-CM

## 2021-03-16 DIAGNOSIS — N400 Enlarged prostate without lower urinary tract symptoms: Secondary | ICD-10-CM | POA: Diagnosis not present

## 2021-03-16 MED ORDER — SILDENAFIL CITRATE 100 MG PO TABS: 100.0000 mg | ORAL_TABLET | Freq: Every day | ORAL | 99 refills | Status: AC | PRN

## 2021-03-16 NOTE — Progress Notes (Signed)
History of Present Illness: 70 year old male sent by Dr. Woody Seller for evaluation and management of BPH.    I last saw him 5 or 6 years ago.  At that time I was treating him for overactive bladder.  He was given Myrbetriq which seemed to help, as he did not tolerate antimuscarinic medications.  He has been off of this medicine for a while.  He is on 2 tamsulosin as well as 2 dutasteride capsules daily.  His dutasteride was started about 4 months ago, his second tamsulosin was added at about the same time.  These have not helped his symptomatology.  IPSS 17, quality-of-life score 4.  He also has problems with erections.  This was present at his last visit with me in 2016.  He is on sildenafil which is expensive where he gets it. Past Medical History:  Diagnosis Date  . BPH (benign prostatic hyperplasia)   . GERD (gastroesophageal reflux disease)   . HTN (hypertension)   . Kidney stone     Past Surgical History:  Procedure Laterality Date  . COLONOSCOPY  05/2008   Dr. Anthony Sar, normal  . COLONOSCOPY N/A 09/25/2013   SLF: 1. the colon was redundant 2. One colon polyp removed  3.  Rectal bleeding due to large internal hemorrhoids  . COLONOSCOPY WITH PROPOFOL N/A 09/07/2020   Procedure: COLONOSCOPY WITH PROPOFOL;  Surgeon: Eloise Harman, DO;  [facility];  Service: Endoscopy;  Laterality: N/A;  8:45am  . ESOPHAGOGASTRODUODENOSCOPY     [facility] per patient, unremarkable  . HEMORRHOID BANDING N/A 09/25/2013   Procedure: HEMORRHOID BANDING;  Surgeon: Danie Binder, MD;  [facility];  Service: Endoscopy;  Laterality: N/A;  . HIATAL HERNIA REPAIR    . POLYPECTOMY  09/07/2020   Procedure: POLYPECTOMY;  Surgeon: Eloise Harman, DO;  [facility];  Service: Endoscopy;;  . THYROID SURGERY      Home Medications:  Allergies as of 03/16/2021      Reactions   Lisinopril Swelling   Other    Had reaction to prostate medication.      Medication List        Accurate as of March 16, 2021 12:31 PM. If you have any questions, ask your nurse or doctor.        amLODipine 5 MG tablet Commonly known as: NORVASC Take 5 mg by mouth daily.   dutasteride 0.5 MG capsule Commonly known as: AVODART Take 0.5 mg by mouth daily.   hydrocortisone 2.5 % rectal cream Commonly known as: Anusol-HC Place 1 application rectally 2 (two) times daily. What changed:   when to take this  reasons to take this   hydrocortisone cream 1 % Apply 1 application topically 2 (two) times daily as needed for itching.   meloxicam 15 MG tablet Commonly known as: MOBIC Take 1 tablet by mouth once daily   metoprolol succinate 100 MG 24 hr tablet Commonly known as: TOPROL-XL Take 100 mg by mouth daily.   pantoprazole 40 MG tablet Commonly known as: PROTONIX Take 40 mg by mouth every evening.   sildenafil 20 MG tablet Commonly known as: REVATIO Take 60 mg by mouth daily as needed (erectile dysfunction.).   tamsulosin 0.4 MG Caps capsule Commonly known as: FLOMAX Take 0.4 mg by mouth every evening.   triamcinolone cream 0.1 % Commonly known as: KENALOG Apply 1 application topically 2 (two) times daily as needed (skin irritation.).       Allergies:  Allergies  Allergen Reactions  . Lisinopril Swelling  . Other     Had reaction to prostate medication.    Family History  Problem Relation Age of Onset  . Colon cancer Neg Hx     Social History:  reports that he has been smoking cigarettes. He started smoking about 49 years ago. He has been smoking about 1.00 pack per day. He has never used smokeless tobacco. He reports that he does not drink alcohol and does not use drugs.  ROS: A complete review of systems was performed.  All systems are negative except for pertinent findings as noted.  Physical Exam:  Vital signs in last 24 hours: There were no vitals taken for this visit. Constitutional:  Alert and oriented, No acute distress Cardiovascular:  Regular rate  Respiratory: Normal respiratory effort GI: Abdomen is soft, nontender, nondistended, no abdominal masses. No CVAT.  Genitourinary: Normal male phallus, testes are descended bilaterally and non-tender and without masses, scrotum is normal in appearance without lesions or masses, perineum is normal on inspection. Lymphatic: No lymphadenopathy Neurologic: Grossly intact, no focal deficits Psychiatric: Normal mood and affect  I have reviewed prior pt notes  I have reviewed notes from referring/previous physicians  I have reviewed urinalysis results  I have independently reviewed prior imaging  I see no PSA in old records sent by his primary care provider  Impression/Assessment:  1.  I think more than BPH he has overactive bladder- treatment for this was successful about 6 years ago with Myrbetriq  2.  Erectile dysfunction, he does well with sildenafil which is expensive where he gets it  3.  Screening for prostate cancer-I am not aware of recent PSAs drawn  Plan:  1.  I did give him a prescription for sildenafil, this was sent to [facility]  2.  I think it is okay to stop his tamsulosin as well as dutasteride  3.  I will give him samples of Myrbetriq 50 mg, he will take 1 every other day  4.  I will see back in a couple of months to see if he has symptomatic improvement

## 2021-03-16 NOTE — Progress Notes (Signed)
Urological Symptom Review  Patient is experiencing the following symptoms: Frequent urination Get up at night to urinate Erection problems (male only)   Review of Systems  Gastrointestinal (upper)  : Indigestion/heartburn  Gastrointestinal (lower) : Negative for lower GI symptoms  Constitutional : Negative for symptoms  Skin: Negative for skin symptoms  Eyes: Negative for eye symptoms  Ear/Nose/Throat : Negative for Ear/Nose/Throat symptoms  Hematologic/Lymphatic: Negative for Hematologic/Lymphatic symptoms  Cardiovascular : Negative for cardiovascular symptoms  Respiratory : Negative for respiratory symptoms  Endocrine: Negative for endocrine symptoms  Musculoskeletal: Negative for musculoskeletal symptoms  Neurological: Negative for neurological symptoms  Psychologic: Negative for psychiatric symptoms

## 2021-03-17 LAB — URINALYSIS, ROUTINE W REFLEX MICROSCOPIC
Bilirubin, UA: NEGATIVE
Glucose, UA: NEGATIVE
Ketones, UA: NEGATIVE
Leukocytes,UA: NEGATIVE
Nitrite, UA: NEGATIVE
Protein,UA: NEGATIVE
RBC, UA: NEGATIVE
Specific Gravity, UA: 1.02 (ref 1.005–1.030)
Urobilinogen, Ur: 0.2 mg/dL (ref 0.2–1.0)
pH, UA: 7 (ref 5.0–7.5)

## 2021-03-27 DIAGNOSIS — N400 Enlarged prostate without lower urinary tract symptoms: Secondary | ICD-10-CM | POA: Diagnosis not present

## 2021-03-27 DIAGNOSIS — N529 Male erectile dysfunction, unspecified: Secondary | ICD-10-CM | POA: Diagnosis not present

## 2021-03-27 DIAGNOSIS — K219 Gastro-esophageal reflux disease without esophagitis: Secondary | ICD-10-CM | POA: Diagnosis not present

## 2021-03-27 DIAGNOSIS — I10 Essential (primary) hypertension: Secondary | ICD-10-CM | POA: Diagnosis not present

## 2021-04-27 DIAGNOSIS — R69 Illness, unspecified: Secondary | ICD-10-CM | POA: Diagnosis not present

## 2021-04-27 DIAGNOSIS — R21 Rash and other nonspecific skin eruption: Secondary | ICD-10-CM | POA: Diagnosis not present

## 2021-04-27 DIAGNOSIS — N400 Enlarged prostate without lower urinary tract symptoms: Secondary | ICD-10-CM | POA: Diagnosis not present

## 2021-04-27 DIAGNOSIS — F17210 Nicotine dependence, cigarettes, uncomplicated: Secondary | ICD-10-CM | POA: Diagnosis not present

## 2021-04-27 DIAGNOSIS — I10 Essential (primary) hypertension: Secondary | ICD-10-CM | POA: Diagnosis not present

## 2021-04-27 DIAGNOSIS — Z299 Encounter for prophylactic measures, unspecified: Secondary | ICD-10-CM | POA: Diagnosis not present

## 2021-05-19 ENCOUNTER — Telehealth: Payer: Self-pay

## 2021-05-19 NOTE — Telephone Encounter (Signed)
Patient called for samples of myrbetriq 50mg  to last him until his next OV with Dr. Diona Fanti. Samples left at desk for pt to pick up.

## 2021-05-27 ENCOUNTER — Ambulatory Visit: Payer: Medicare HMO | Admitting: Gastroenterology

## 2021-05-27 ENCOUNTER — Encounter: Payer: Self-pay | Admitting: Gastroenterology

## 2021-05-27 ENCOUNTER — Other Ambulatory Visit: Payer: Self-pay

## 2021-05-27 VITALS — BP 139/81 | HR 54 | Temp 97.5°F | Ht 70.0 in | Wt 247.6 lb

## 2021-05-27 DIAGNOSIS — K641 Second degree hemorrhoids: Secondary | ICD-10-CM

## 2021-05-27 MED ORDER — HYDROCORTISONE (PERIANAL) 2.5 % EX CREA: 1.0000 "application " | TOPICAL_CREAM | Freq: Two times a day (BID) | CUTANEOUS | 1 refills | Status: DC

## 2021-05-27 NOTE — Patient Instructions (Addendum)
Please call me if any issues!  I have sent in a rectal cream to use as needed.    We will arrange a visit in follow-up but you can cancel if needed!  I enjoyed seeing you again today! As you know, I value our relationship and want to provide genuine, compassionate, and quality care. I welcome your feedback. If you receive a survey regarding your visit,  I greatly appreciate you taking time to fill this out. See you next time!  Annitta Needs, PhD, ANP-BC [facility]

## 2021-05-27 NOTE — Progress Notes (Signed)
[facility] Banding Note:  Pleasant 70 year old male presenting today with symptomatic hemorrhoids. Recent colonoscopy with skin tags perianal exam. Non-bleeding internal hemorrhoids. Multiple small-mouthed diverticula in sigmoid and descending colon. 5 mm polyp in transverse colon. Polyp benign. Surveillance in 5 years. Has undergone banding of left lateral, right posterior, and right anterior. He is here today for likely neutral banding, as he had large hemorrhoids. Still with bleeding. No bleeding.    The patient presents with symptomatic grade 2 hemorrhoids, unresponsive to maximal medical therapy, requesting rubber band ligation of his hemorrhoidal disease. All risks, benefits, and alternative forms of therapy were described and informed consent was obtained.   The decision was made to band neutrally, and the [place] was used to perform band ligation without complication. Digital anorectal examination was then performed to assure proper positioning of the band, and to adjust the banded tissue as required. The patient was discharged home without pain or other issues. Dietary and behavioral recommendations were given and (if necessary prescriptions were given), along with follow-up instructions. The patient will return in routine follow-up in future. He is to call if needed additional banding consideration.    No complications were encountered and the patient tolerated the procedure well.   Annitta Needs, PhD, ANP-BC [facility]astroenterology

## 2021-06-01 ENCOUNTER — Ambulatory Visit: Payer: Medicare HMO | Admitting: Urology

## 2021-06-01 ENCOUNTER — Other Ambulatory Visit: Payer: Self-pay

## 2021-06-01 ENCOUNTER — Encounter: Payer: Self-pay | Admitting: Urology

## 2021-06-01 VITALS — BP 155/83 | HR 60

## 2021-06-01 DIAGNOSIS — R350 Frequency of micturition: Secondary | ICD-10-CM

## 2021-06-01 DIAGNOSIS — N5201 Erectile dysfunction due to arterial insufficiency: Secondary | ICD-10-CM

## 2021-06-01 DIAGNOSIS — R3915 Urgency of urination: Secondary | ICD-10-CM | POA: Diagnosis not present

## 2021-06-01 DIAGNOSIS — N400 Enlarged prostate without lower urinary tract symptoms: Secondary | ICD-10-CM | POA: Diagnosis not present

## 2021-06-01 NOTE — Progress Notes (Signed)
Urological Symptom Review  Patient is experiencing the following symptoms: Get up at night to urinate Stream starts and stops   Review of Systems  Gastrointestinal (upper)  : Negative for upper GI symptoms  Gastrointestinal (lower) : Negative for lower GI symptoms  Constitutional : Negative for symptoms  Skin: Negative for skin symptoms  Eyes: Negative for eye symptoms  Ear/Nose/Throat : Negative for Ear/Nose/Throat symptoms  Hematologic/Lymphatic: Negative for Hematologic/Lymphatic symptoms  Cardiovascular : Negative for cardiovascular symptoms  Respiratory : Negative for respiratory symptoms  Endocrine: Negative for endocrine symptoms  Musculoskeletal: Negative for musculoskeletal symptoms  Neurological: Negative for neurological symptoms  Psychologic: Negative for psychiatric symptoms 

## 2021-06-01 NOTE — Progress Notes (Signed)
History of Present Illness:   4.19.2022: I last saw him 5 or 6 years ago.  At that time I was treating him for overactive bladder.  He was given Myrbetriq which seemed to help, as he did not tolerate antimuscarinic medications.  He has been off of this medicine for a while.   He is on 2 tamsulosin as well as 2 dutasteride capsules daily.  His dutasteride was started about 4 months ago, his second tamsulosin was added at about the same time.  These have not helped his symptomatology.   IPSS 17, quality-of-life score 4.   He also has problems with erections.  This was present at his last visit with me in 2016.  He is on sildenafil which is expensive where he gets it.  7.5.2022: He is still doing fairly well with regard to voiding.  He is only on Myrbetriq 50 mg every other day.  He is off the dutasteride as well as the tamsulosin.  100 mg of sildenafil does well with his ED. Past Medical History:  Diagnosis Date   BPH (benign prostatic hyperplasia)    GERD (gastroesophageal reflux disease)    HTN (hypertension)    Kidney stone     Past Surgical History:  Procedure Laterality Date   COLONOSCOPY  05/2008   Dr. Anthony Sar, normal   COLONOSCOPY N/A 09/25/2013   SLF: 1. the colon was redundant 2. One colon polyp removed  3.  Rectal bleeding due to large internal hemorrhoids   COLONOSCOPY WITH PROPOFOL N/A 09/07/2020   Procedure: COLONOSCOPY WITH PROPOFOL;  Surgeon: Eloise Harman, DO;  [facility];  Service: Endoscopy;  Laterality: N/A;  8:45am   ESOPHAGOGASTRODUODENOSCOPY     [place] per patient, unremarkable   HEMORRHOID BANDING N/A 09/25/2013   Procedure: HEMORRHOID BANDING;  Surgeon: Danie Binder, MD;  [facility];  Service: Endoscopy;  Laterality: N/A;   HIATAL HERNIA REPAIR     POLYPECTOMY  09/07/2020   Procedure: POLYPECTOMY;  Surgeon: Eloise Harman, DO;  [facility];  Service: Endoscopy;;   THYROID SURGERY      Home Medications:  Allergies  as of 06/01/2021       Reactions   Lisinopril Swelling   Other    Had reaction to prostate medication.        Medication List        Accurate as of June 01, 2021 12:50 PM. If you have any questions, ask your nurse or doctor.          amLODipine 5 MG tablet Commonly known as: NORVASC Take 5 mg by mouth daily.   hydrocortisone 2.5 % rectal cream Commonly known as: Anusol-HC Place 1 application rectally 2 (two) times daily.   hydrocortisone 2.5 % rectal cream Commonly known as: ANUSOL-HC Place 1 application rectally 2 (two) times daily.   hydrocortisone cream 1 % Apply 1 application topically 2 (two) times daily as needed for itching.   metoprolol succinate 100 MG 24 hr tablet Commonly known as: TOPROL-XL Take 100 mg by mouth daily.   Myrbetriq 50 MG Tb24 tablet Generic drug: mirabegron ER Take 50 mg by mouth every 3 (three) days.   pantoprazole 40 MG tablet Commonly known as: PROTONIX Take 40 mg by mouth every evening.   sildenafil 100 MG tablet Commonly known as: VIAGRA Take 1 tablet (100 mg total) by mouth daily as needed for erectile dysfunction.   sildenafil 20 MG tablet Commonly known as: REVATIO Take 60  mg by mouth daily as needed (erectile dysfunction.).   triamcinolone cream 0.1 % Commonly known as: KENALOG Apply 1 application topically 2 (two) times daily as needed (skin irritation.).        Allergies:  Allergies  Allergen Reactions   Lisinopril Swelling   Other     Had reaction to prostate medication.    Family History  Problem Relation Age of Onset   Colon cancer Neg Hx     Social History:  reports that he has been smoking cigarettes. He started smoking about 49 years ago. He has been smoking an average of 1.00 packs per day. He has never used smokeless tobacco. He reports that he does not drink alcohol and does not use drugs.  ROS: A complete review of systems was performed.  All systems are negative except for pertinent findings  as noted.  Physical Exam:  Vital signs in last 24 hours: There were no vitals taken for this visit. Constitutional:  Alert and oriented, No acute distress Cardiovascular: Regular rate  Respiratory: Normal respiratory effort Neurologic: Grossly intact, no focal deficits Psychiatric: Normal mood and affect  I have reviewed prior pt notes    Impression/Assessment:  1.  Lower urinary tract symptoms, doing well with Myrbetriq 50 mg every other day  2.  ED, on sildenafil  Plan:  1.  For now he will continue the same dose of Myrbetriq.  Unfortunately, he cannot tolerate antimuscarinic agents which in the past have caused significant side effects  2.  I will see back in a year for recheck

## 2021-06-27 DIAGNOSIS — N529 Male erectile dysfunction, unspecified: Secondary | ICD-10-CM | POA: Diagnosis not present

## 2021-06-27 DIAGNOSIS — K219 Gastro-esophageal reflux disease without esophagitis: Secondary | ICD-10-CM | POA: Diagnosis not present

## 2021-06-27 DIAGNOSIS — I10 Essential (primary) hypertension: Secondary | ICD-10-CM | POA: Diagnosis not present

## 2021-06-27 DIAGNOSIS — N400 Enlarged prostate without lower urinary tract symptoms: Secondary | ICD-10-CM | POA: Diagnosis not present

## 2021-06-28 DIAGNOSIS — U071 COVID-19: Secondary | ICD-10-CM | POA: Diagnosis not present

## 2021-08-13 DIAGNOSIS — H524 Presbyopia: Secondary | ICD-10-CM | POA: Diagnosis not present

## 2021-08-13 DIAGNOSIS — H40013 Open angle with borderline findings, low risk, bilateral: Secondary | ICD-10-CM | POA: Diagnosis not present

## 2021-08-27 DIAGNOSIS — I10 Essential (primary) hypertension: Secondary | ICD-10-CM | POA: Diagnosis not present

## 2021-08-27 DIAGNOSIS — E7800 Pure hypercholesterolemia, unspecified: Secondary | ICD-10-CM | POA: Diagnosis not present

## 2021-09-27 DIAGNOSIS — N529 Male erectile dysfunction, unspecified: Secondary | ICD-10-CM | POA: Diagnosis not present

## 2021-09-27 DIAGNOSIS — N400 Enlarged prostate without lower urinary tract symptoms: Secondary | ICD-10-CM | POA: Diagnosis not present

## 2021-09-27 DIAGNOSIS — K219 Gastro-esophageal reflux disease without esophagitis: Secondary | ICD-10-CM | POA: Diagnosis not present

## 2021-10-11 NOTE — Progress Notes (Signed)
           Stephen Hamilton is a 70 y.o. male presenting today with a history of symptomatic hemorrhoids. Recent colonoscopy with skin tags perianal exam. Non-bleeding internal hemorrhoids. Multiple small-mouthed diverticula in sigmoid and descending colon. 5 mm polyp in transverse colon. Polyp benign. Surveillance in 5 years. Has undergone banding of left lateral, right posterior, right anterior, and neutral.   Visit was made inadvertently and was not needing to be seen today. We have cancelled office visit from today. He will not be billed. He is doing well and no complaints. I did send in a refill for anusol cream to have on hand.   Surveillance colonoscopy in 2026. He is to call with any concerns in meantime.

## 2021-10-12 ENCOUNTER — Encounter: Payer: Self-pay | Admitting: Gastroenterology

## 2021-10-12 ENCOUNTER — Other Ambulatory Visit: Payer: Self-pay

## 2021-10-12 ENCOUNTER — Ambulatory Visit (INDEPENDENT_AMBULATORY_CARE_PROVIDER_SITE_OTHER): Payer: Medicare HMO | Admitting: Gastroenterology

## 2021-10-12 VITALS — BP 134/85 | HR 55 | Temp 97.7°F | Ht 70.0 in | Wt 259.0 lb

## 2021-10-12 DIAGNOSIS — F17210 Nicotine dependence, cigarettes, uncomplicated: Secondary | ICD-10-CM | POA: Diagnosis not present

## 2021-10-12 DIAGNOSIS — Z7189 Other specified counseling: Secondary | ICD-10-CM | POA: Diagnosis not present

## 2021-10-12 DIAGNOSIS — I10 Essential (primary) hypertension: Secondary | ICD-10-CM | POA: Diagnosis not present

## 2021-10-12 DIAGNOSIS — Z299 Encounter for prophylactic measures, unspecified: Secondary | ICD-10-CM | POA: Diagnosis not present

## 2021-10-12 DIAGNOSIS — R69 Illness, unspecified: Secondary | ICD-10-CM | POA: Diagnosis not present

## 2021-10-12 DIAGNOSIS — Z6836 Body mass index (BMI) 36.0-36.9, adult: Secondary | ICD-10-CM | POA: Diagnosis not present

## 2021-10-12 DIAGNOSIS — R5383 Other fatigue: Secondary | ICD-10-CM | POA: Diagnosis not present

## 2021-10-12 DIAGNOSIS — Z1331 Encounter for screening for depression: Secondary | ICD-10-CM | POA: Diagnosis not present

## 2021-10-12 DIAGNOSIS — K642 Third degree hemorrhoids: Secondary | ICD-10-CM

## 2021-10-12 DIAGNOSIS — Z1339 Encounter for screening examination for other mental health and behavioral disorders: Secondary | ICD-10-CM | POA: Diagnosis not present

## 2021-10-12 DIAGNOSIS — Z0000 Encounter for general adult medical examination without abnormal findings: Secondary | ICD-10-CM | POA: Diagnosis not present

## 2021-10-12 MED ORDER — HYDROCORTISONE (PERIANAL) 2.5 % EX CREA
1.0000 | TOPICAL_CREAM | Freq: Two times a day (BID) | CUTANEOUS | 1 refills | Status: DC
Start: 2021-10-12 — End: 2022-05-16

## 2021-10-12 NOTE — Patient Instructions (Signed)
No office visit today. No charge to patient.   Return prn.

## 2021-10-13 DIAGNOSIS — R5383 Other fatigue: Secondary | ICD-10-CM | POA: Diagnosis not present

## 2021-10-13 DIAGNOSIS — Z79899 Other long term (current) drug therapy: Secondary | ICD-10-CM | POA: Diagnosis not present

## 2021-10-13 DIAGNOSIS — Z0000 Encounter for general adult medical examination without abnormal findings: Secondary | ICD-10-CM | POA: Diagnosis not present

## 2021-10-13 DIAGNOSIS — E7800 Pure hypercholesterolemia, unspecified: Secondary | ICD-10-CM | POA: Diagnosis not present

## 2021-10-13 DIAGNOSIS — Z125 Encounter for screening for malignant neoplasm of prostate: Secondary | ICD-10-CM | POA: Diagnosis not present

## 2021-11-26 DIAGNOSIS — N529 Male erectile dysfunction, unspecified: Secondary | ICD-10-CM | POA: Diagnosis not present

## 2021-11-26 DIAGNOSIS — K219 Gastro-esophageal reflux disease without esophagitis: Secondary | ICD-10-CM | POA: Diagnosis not present

## 2021-11-26 DIAGNOSIS — N400 Enlarged prostate without lower urinary tract symptoms: Secondary | ICD-10-CM | POA: Diagnosis not present

## 2021-11-26 DIAGNOSIS — I10 Essential (primary) hypertension: Secondary | ICD-10-CM | POA: Diagnosis not present

## 2021-12-28 DIAGNOSIS — N529 Male erectile dysfunction, unspecified: Secondary | ICD-10-CM | POA: Diagnosis not present

## 2021-12-28 DIAGNOSIS — N400 Enlarged prostate without lower urinary tract symptoms: Secondary | ICD-10-CM | POA: Diagnosis not present

## 2021-12-28 DIAGNOSIS — K219 Gastro-esophageal reflux disease without esophagitis: Secondary | ICD-10-CM | POA: Diagnosis not present

## 2021-12-28 DIAGNOSIS — I10 Essential (primary) hypertension: Secondary | ICD-10-CM | POA: Diagnosis not present

## 2022-01-14 DIAGNOSIS — Z6835 Body mass index (BMI) 35.0-35.9, adult: Secondary | ICD-10-CM | POA: Diagnosis not present

## 2022-01-14 DIAGNOSIS — F17210 Nicotine dependence, cigarettes, uncomplicated: Secondary | ICD-10-CM | POA: Diagnosis not present

## 2022-01-14 DIAGNOSIS — Z8249 Family history of ischemic heart disease and other diseases of the circulatory system: Secondary | ICD-10-CM | POA: Diagnosis not present

## 2022-01-14 DIAGNOSIS — N529 Male erectile dysfunction, unspecified: Secondary | ICD-10-CM | POA: Diagnosis not present

## 2022-01-14 DIAGNOSIS — I10 Essential (primary) hypertension: Secondary | ICD-10-CM | POA: Diagnosis not present

## 2022-01-14 DIAGNOSIS — N3941 Urge incontinence: Secondary | ICD-10-CM | POA: Diagnosis not present

## 2022-01-14 DIAGNOSIS — R69 Illness, unspecified: Secondary | ICD-10-CM | POA: Diagnosis not present

## 2022-01-14 DIAGNOSIS — K219 Gastro-esophageal reflux disease without esophagitis: Secondary | ICD-10-CM | POA: Diagnosis not present

## 2022-01-19 DIAGNOSIS — Z6836 Body mass index (BMI) 36.0-36.9, adult: Secondary | ICD-10-CM | POA: Diagnosis not present

## 2022-01-19 DIAGNOSIS — I10 Essential (primary) hypertension: Secondary | ICD-10-CM | POA: Diagnosis not present

## 2022-01-19 DIAGNOSIS — J069 Acute upper respiratory infection, unspecified: Secondary | ICD-10-CM | POA: Diagnosis not present

## 2022-01-19 DIAGNOSIS — K921 Melena: Secondary | ICD-10-CM | POA: Diagnosis not present

## 2022-01-19 DIAGNOSIS — Z299 Encounter for prophylactic measures, unspecified: Secondary | ICD-10-CM | POA: Diagnosis not present

## 2022-02-08 ENCOUNTER — Other Ambulatory Visit: Payer: Self-pay

## 2022-02-08 ENCOUNTER — Telehealth: Payer: Self-pay | Admitting: Urology

## 2022-02-08 MED ORDER — MIRABEGRON ER 50 MG PO TB24: 50.0000 mg | ORAL_TABLET | ORAL | 11 refills | Status: AC

## 2022-02-08 NOTE — Telephone Encounter (Signed)
Refill submitted. 

## 2022-02-08 NOTE — Telephone Encounter (Signed)
Patient called the office today requesting a prescription for Myrbetriq. ?

## 2022-02-08 NOTE — Telephone Encounter (Signed)
Patient is calling to request samples of  ?Myrbetriq.   ? ?He cannot afford cost of the Rx. ? ?Please advise. ? ?Thanks, ?Helene Kelp ? ?

## 2022-02-09 NOTE — Telephone Encounter (Signed)
Samples left at desk for patient. Patient called and notified  ?

## 2022-02-10 DIAGNOSIS — H40053 Ocular hypertension, bilateral: Secondary | ICD-10-CM | POA: Diagnosis not present

## 2022-05-16 ENCOUNTER — Other Ambulatory Visit: Payer: Self-pay | Admitting: Gastroenterology

## 2022-05-16 NOTE — Telephone Encounter (Signed)
Last ov was 10-12-2021

## 2022-05-16 NOTE — Telephone Encounter (Signed)
Phoned and advised the pt that his Rx was sent to [facility]

## 2022-05-16 NOTE — Telephone Encounter (Signed)
Pt request a refill on his Pro[employer]o-Med HC 2.5%  External Cream

## 2022-06-07 ENCOUNTER — Ambulatory Visit (INDEPENDENT_AMBULATORY_CARE_PROVIDER_SITE_OTHER): Payer: Medicare HMO | Admitting: Urology

## 2022-06-07 ENCOUNTER — Encounter: Payer: Self-pay | Admitting: Urology

## 2022-06-07 VITALS — BP 136/73 | HR 114

## 2022-06-07 DIAGNOSIS — R350 Frequency of micturition: Secondary | ICD-10-CM | POA: Diagnosis not present

## 2022-06-07 DIAGNOSIS — Z125 Encounter for screening for malignant neoplasm of prostate: Secondary | ICD-10-CM

## 2022-06-07 DIAGNOSIS — R3915 Urgency of urination: Secondary | ICD-10-CM

## 2022-06-07 DIAGNOSIS — N400 Enlarged prostate without lower urinary tract symptoms: Secondary | ICD-10-CM | POA: Diagnosis not present

## 2022-06-07 DIAGNOSIS — N5201 Erectile dysfunction due to arterial insufficiency: Secondary | ICD-10-CM | POA: Diagnosis not present

## 2022-06-07 LAB — URINALYSIS, ROUTINE W REFLEX MICROSCOPIC
Bilirubin, UA: NEGATIVE
Glucose, UA: NEGATIVE
Ketones, UA: NEGATIVE
Leukocytes,UA: NEGATIVE
Nitrite, UA: NEGATIVE
Specific Gravity, UA: >1.03 — ABNORMAL HIGH (ref 1.005–1.030)
Urobilinogen, Ur: 0.2 mg/dL (ref 0.2–1.0)
pH, UA: 5 (ref 5.0–7.5)

## 2022-06-07 LAB — MICROSCOPIC EXAMINATION
Bacteria, UA: NONE SEEN
Epithelial Cells (non renal): NONE SEEN /hpf (ref 0–10)
Renal Epithel, UA: NONE SEEN /hpf
WBC, UA: NONE SEEN /hpf (ref 0–5)

## 2022-06-07 NOTE — Progress Notes (Signed)
History of Present Illness: Here for follow-up of voiding symptoms.  In the past he has been on dual medical therapy for BPH.  That was eventually stopped.  He is now treated for OAB.  He did not tolerate antimuscarinics, so he is on Myrbetriq 50 mg which he takes either every other day or every third day.  Overall, this helps his urinary situation.  His symptoms are typically worse in the winter biggest issue is nocturia x2-3 as well as weak stream and urgency.  Past Medical History:  Diagnosis Date   BPH (benign prostatic hyperplasia)    GERD (gastroesophageal reflux disease)    HTN (hypertension)    Kidney stone     Past Surgical History:  Procedure Laterality Date   COLONOSCOPY  05/2008   Dr. Anthony Sar, normal   COLONOSCOPY N/A 09/25/2013   SLF: 1. the colon was redundant 2. One colon polyp removed  3.  Rectal bleeding due to large internal hemorrhoids   COLONOSCOPY WITH PROPOFOL N/A 09/07/2020   Procedure: COLONOSCOPY WITH PROPOFOL;  Surgeon: Eloise Harman, DO;  Location: [facility];  Service: Endoscopy;  Laterality: N/A;  8:45am   ESOPHAGOGASTRODUODENOSCOPY     [place] per patient, unremarkable   HEMORRHOID BANDING N/A 09/25/2013   Procedure: HEMORRHOID BANDING;  Surgeon: Danie Binder, MD;  Location: [facility];  Service: Endoscopy;  Laterality: N/A;   HIATAL HERNIA REPAIR     POLYPECTOMY  09/07/2020   Procedure: POLYPECTOMY;  Surgeon: Eloise Harman, DO;  Location: [facility];  Service: Endoscopy;;   THYROID SURGERY      Home Medications:  Allergies as of 06/07/2022       Reactions   Lisinopril Swelling   Other    Had reaction to prostate medication.        Medication List        Accurate as of June 07, 2022  7:58 AM. If you have any questions, ask your nurse or doctor.          amLODipine 5 MG tablet Commonly known as: NORVASC Take 5 mg by mouth daily.   metoprolol succinate 100 MG 24 hr tablet Commonly known as: TOPROL-XL Take 100 mg by  mouth daily.   mirabegron ER 50 MG Tb24 tablet Commonly known as: MYRBETRIQ Take 1 tablet (50 mg total) by mouth every 3 (three) days.   pantoprazole 40 MG tablet Commonly known as: PROTONIX Take 40 mg by mouth every evening.   Procto-Med HC 2.5 % rectal cream Generic drug: hydrocortisone APPLY  CREAM RECTALLY TWICE DAILY   sildenafil 100 MG tablet Commonly known as: VIAGRA Take 1 tablet (100 mg total) by mouth daily as needed for erectile dysfunction.   sildenafil 20 MG tablet Commonly known as: REVATIO Take 60 mg by mouth daily as needed (erectile dysfunction.).   triamcinolone cream 0.1 % Commonly known as: KENALOG Apply 1 application topically 2 (two) times daily as needed (skin irritation.).        Allergies:  Allergies  Allergen Reactions   Lisinopril Swelling   Other     Had reaction to prostate medication.    Family History  Problem Relation Age of Onset   Colon cancer Neg Hx     Social History:  reports that he has been smoking cigarettes. He started smoking about 50 years ago. He has been smoking an average of 1 pack per day. He has never used smokeless tobacco. He reports current alcohol use. He reports that he  does not use drugs.  ROS: A complete review of systems was performed.  All systems are negative except for pertinent findings as noted.  Physical Exam:  Vital signs in last 24 hours: There were no vitals taken for this visit. Constitutional:  Alert and oriented, No acute distress Cardiovascular: Regular rate.  He has 2-3+ bilateral pretibial edema Respiratory: Normal respiratory effort GI: Abdomen is soft, nontender, nondistended, no abdominal masses. No CVAT.  Genitourinary: Normal male phallus, testes are descended bilaterally and non-tender and without masses, scrotum is normal in appearance without lesions or masses, perineum is normal on inspection.  There is a right-sided hydrocele.  Normal anal sphincter tone.  Prostate 30 to 40 g,  symmetric Lymphatic: No lymphadenopathy Neurologic: Grossly intact, no focal deficits Psychiatric: Normal mood and affect  I have reviewed prior pt notes  I have reviewed notes from referring/previous physicians  I have reviewed urinalysis results-clear  He states that his PSA normal   Impression/Assessment:  1.  Lower urinary tract symptoms.  Improved with Myrbetriq at low-dose.  Samples were provided from this office as this causes too much  2.  Screening for prostate cancer.  DRE is normal today.  The patient states that Dr. Woody Seller checked his PSA and that it has been normal  Plan:  1.  Continue Myrbetriq 50 mg every other day or every third day  2.  We will see if we can get financial help for him to get this on a regular basis  3.  Follow-up 1 year

## 2022-06-08 DIAGNOSIS — F17210 Nicotine dependence, cigarettes, uncomplicated: Secondary | ICD-10-CM | POA: Diagnosis not present

## 2022-06-08 DIAGNOSIS — Z6835 Body mass index (BMI) 35.0-35.9, adult: Secondary | ICD-10-CM | POA: Diagnosis not present

## 2022-06-08 DIAGNOSIS — R600 Localized edema: Secondary | ICD-10-CM | POA: Diagnosis not present

## 2022-06-08 DIAGNOSIS — Z299 Encounter for prophylactic measures, unspecified: Secondary | ICD-10-CM | POA: Diagnosis not present

## 2022-06-08 DIAGNOSIS — R69 Illness, unspecified: Secondary | ICD-10-CM | POA: Diagnosis not present

## 2022-06-08 DIAGNOSIS — J309 Allergic rhinitis, unspecified: Secondary | ICD-10-CM | POA: Diagnosis not present

## 2022-06-08 DIAGNOSIS — I10 Essential (primary) hypertension: Secondary | ICD-10-CM | POA: Diagnosis not present

## 2022-07-11 DIAGNOSIS — I10 Essential (primary) hypertension: Secondary | ICD-10-CM | POA: Diagnosis not present

## 2022-07-11 DIAGNOSIS — R69 Illness, unspecified: Secondary | ICD-10-CM | POA: Diagnosis not present

## 2022-07-11 DIAGNOSIS — M25512 Pain in left shoulder: Secondary | ICD-10-CM | POA: Diagnosis not present

## 2022-07-11 DIAGNOSIS — F17210 Nicotine dependence, cigarettes, uncomplicated: Secondary | ICD-10-CM | POA: Diagnosis not present

## 2022-07-11 DIAGNOSIS — Z6836 Body mass index (BMI) 36.0-36.9, adult: Secondary | ICD-10-CM | POA: Diagnosis not present

## 2022-07-11 DIAGNOSIS — Z299 Encounter for prophylactic measures, unspecified: Secondary | ICD-10-CM | POA: Diagnosis not present

## 2022-07-11 DIAGNOSIS — M542 Cervicalgia: Secondary | ICD-10-CM | POA: Diagnosis not present

## 2022-07-14 ENCOUNTER — Telehealth: Payer: Self-pay

## 2022-07-14 NOTE — Telephone Encounter (Signed)
-----   Message from Franchot Gallo, MD sent at 06/07/2022  2:51 PM EDT ----- Can you see if Frankey Poot can look into financial help for this man getting Myrbetriq--he has been surviving on samples from [place]

## 2022-07-14 NOTE — Telephone Encounter (Signed)
[employer] form started with patient consent over telephone.   MD to sign prior to faxing back. Will have Dr. Diona Fanti completed on 08/22 when he returns to office.

## 2022-07-18 DIAGNOSIS — R29898 Other symptoms and signs involving the musculoskeletal system: Secondary | ICD-10-CM | POA: Diagnosis not present

## 2022-07-18 DIAGNOSIS — M5412 Radiculopathy, cervical region: Secondary | ICD-10-CM | POA: Diagnosis not present

## 2022-07-18 DIAGNOSIS — M542 Cervicalgia: Secondary | ICD-10-CM | POA: Diagnosis not present

## 2022-07-18 DIAGNOSIS — Z789 Other specified health status: Secondary | ICD-10-CM | POA: Diagnosis not present

## 2022-07-18 DIAGNOSIS — M2569 Stiffness of other specified joint, not elsewhere classified: Secondary | ICD-10-CM | POA: Diagnosis not present

## 2022-07-18 DIAGNOSIS — R200 Anesthesia of skin: Secondary | ICD-10-CM | POA: Diagnosis not present

## 2022-07-22 DIAGNOSIS — R200 Anesthesia of skin: Secondary | ICD-10-CM | POA: Diagnosis not present

## 2022-07-22 DIAGNOSIS — R29898 Other symptoms and signs involving the musculoskeletal system: Secondary | ICD-10-CM | POA: Diagnosis not present

## 2022-07-22 DIAGNOSIS — M2569 Stiffness of other specified joint, not elsewhere classified: Secondary | ICD-10-CM | POA: Diagnosis not present

## 2022-07-22 DIAGNOSIS — M5412 Radiculopathy, cervical region: Secondary | ICD-10-CM | POA: Diagnosis not present

## 2022-07-22 DIAGNOSIS — Z789 Other specified health status: Secondary | ICD-10-CM | POA: Diagnosis not present

## 2022-07-22 DIAGNOSIS — M542 Cervicalgia: Secondary | ICD-10-CM | POA: Diagnosis not present

## 2022-07-27 DIAGNOSIS — R200 Anesthesia of skin: Secondary | ICD-10-CM | POA: Diagnosis not present

## 2022-07-27 DIAGNOSIS — Z789 Other specified health status: Secondary | ICD-10-CM | POA: Diagnosis not present

## 2022-07-27 DIAGNOSIS — M2569 Stiffness of other specified joint, not elsewhere classified: Secondary | ICD-10-CM | POA: Diagnosis not present

## 2022-07-27 DIAGNOSIS — R29898 Other symptoms and signs involving the musculoskeletal system: Secondary | ICD-10-CM | POA: Diagnosis not present

## 2022-07-27 DIAGNOSIS — M5412 Radiculopathy, cervical region: Secondary | ICD-10-CM | POA: Diagnosis not present

## 2022-07-27 DIAGNOSIS — M542 Cervicalgia: Secondary | ICD-10-CM | POA: Diagnosis not present

## 2022-08-03 DIAGNOSIS — R200 Anesthesia of skin: Secondary | ICD-10-CM | POA: Diagnosis not present

## 2022-08-03 DIAGNOSIS — R29898 Other symptoms and signs involving the musculoskeletal system: Secondary | ICD-10-CM | POA: Diagnosis not present

## 2022-08-03 DIAGNOSIS — M5412 Radiculopathy, cervical region: Secondary | ICD-10-CM | POA: Diagnosis not present

## 2022-08-03 DIAGNOSIS — Z789 Other specified health status: Secondary | ICD-10-CM | POA: Diagnosis not present

## 2022-08-03 DIAGNOSIS — M2569 Stiffness of other specified joint, not elsewhere classified: Secondary | ICD-10-CM | POA: Diagnosis not present

## 2022-08-03 DIAGNOSIS — M542 Cervicalgia: Secondary | ICD-10-CM | POA: Diagnosis not present

## 2022-08-12 DIAGNOSIS — M5412 Radiculopathy, cervical region: Secondary | ICD-10-CM | POA: Diagnosis not present

## 2022-08-12 DIAGNOSIS — M542 Cervicalgia: Secondary | ICD-10-CM | POA: Diagnosis not present

## 2022-08-12 DIAGNOSIS — M2569 Stiffness of other specified joint, not elsewhere classified: Secondary | ICD-10-CM | POA: Diagnosis not present

## 2022-08-12 DIAGNOSIS — R200 Anesthesia of skin: Secondary | ICD-10-CM | POA: Diagnosis not present

## 2022-08-12 DIAGNOSIS — Z789 Other specified health status: Secondary | ICD-10-CM | POA: Diagnosis not present

## 2022-08-12 DIAGNOSIS — R29898 Other symptoms and signs involving the musculoskeletal system: Secondary | ICD-10-CM | POA: Diagnosis not present

## 2022-08-15 DIAGNOSIS — H524 Presbyopia: Secondary | ICD-10-CM | POA: Diagnosis not present

## 2022-08-15 DIAGNOSIS — H40053 Ocular hypertension, bilateral: Secondary | ICD-10-CM | POA: Diagnosis not present

## 2022-09-28 DIAGNOSIS — I10 Essential (primary) hypertension: Secondary | ICD-10-CM | POA: Diagnosis not present

## 2022-09-28 DIAGNOSIS — J069 Acute upper respiratory infection, unspecified: Secondary | ICD-10-CM | POA: Diagnosis not present

## 2022-10-18 DIAGNOSIS — Z299 Encounter for prophylactic measures, unspecified: Secondary | ICD-10-CM | POA: Diagnosis not present

## 2022-10-18 DIAGNOSIS — Z1339 Encounter for screening examination for other mental health and behavioral disorders: Secondary | ICD-10-CM | POA: Diagnosis not present

## 2022-10-18 DIAGNOSIS — Z6836 Body mass index (BMI) 36.0-36.9, adult: Secondary | ICD-10-CM | POA: Diagnosis not present

## 2022-10-18 DIAGNOSIS — Z0000 Encounter for general adult medical examination without abnormal findings: Secondary | ICD-10-CM | POA: Diagnosis not present

## 2022-10-18 DIAGNOSIS — R5383 Other fatigue: Secondary | ICD-10-CM | POA: Diagnosis not present

## 2022-10-18 DIAGNOSIS — R69 Illness, unspecified: Secondary | ICD-10-CM | POA: Diagnosis not present

## 2022-10-18 DIAGNOSIS — M47812 Spondylosis without myelopathy or radiculopathy, cervical region: Secondary | ICD-10-CM | POA: Diagnosis not present

## 2022-10-18 DIAGNOSIS — F17210 Nicotine dependence, cigarettes, uncomplicated: Secondary | ICD-10-CM | POA: Diagnosis not present

## 2022-10-18 DIAGNOSIS — I10 Essential (primary) hypertension: Secondary | ICD-10-CM | POA: Diagnosis not present

## 2022-10-18 DIAGNOSIS — Z7189 Other specified counseling: Secondary | ICD-10-CM | POA: Diagnosis not present

## 2022-10-18 DIAGNOSIS — R059 Cough, unspecified: Secondary | ICD-10-CM | POA: Diagnosis not present

## 2022-10-18 DIAGNOSIS — Z1331 Encounter for screening for depression: Secondary | ICD-10-CM | POA: Diagnosis not present

## 2022-10-19 DIAGNOSIS — E7800 Pure hypercholesterolemia, unspecified: Secondary | ICD-10-CM | POA: Diagnosis not present

## 2022-10-19 DIAGNOSIS — Z0000 Encounter for general adult medical examination without abnormal findings: Secondary | ICD-10-CM | POA: Diagnosis not present

## 2022-10-19 DIAGNOSIS — Z79899 Other long term (current) drug therapy: Secondary | ICD-10-CM | POA: Diagnosis not present

## 2022-10-19 DIAGNOSIS — Z125 Encounter for screening for malignant neoplasm of prostate: Secondary | ICD-10-CM | POA: Diagnosis not present

## 2022-10-19 DIAGNOSIS — R5383 Other fatigue: Secondary | ICD-10-CM | POA: Diagnosis not present

## 2022-10-28 DIAGNOSIS — N200 Calculus of kidney: Secondary | ICD-10-CM | POA: Diagnosis not present

## 2022-10-28 DIAGNOSIS — J439 Emphysema, unspecified: Secondary | ICD-10-CM | POA: Diagnosis not present

## 2022-10-28 DIAGNOSIS — R9389 Abnormal findings on diagnostic imaging of other specified body structures: Secondary | ICD-10-CM | POA: Diagnosis not present

## 2022-10-28 DIAGNOSIS — R059 Cough, unspecified: Secondary | ICD-10-CM | POA: Diagnosis not present

## 2022-10-28 DIAGNOSIS — I517 Cardiomegaly: Secondary | ICD-10-CM | POA: Diagnosis not present

## 2022-10-28 DIAGNOSIS — I2510 Atherosclerotic heart disease of native coronary artery without angina pectoris: Secondary | ICD-10-CM | POA: Diagnosis not present

## 2022-11-02 DIAGNOSIS — Z299 Encounter for prophylactic measures, unspecified: Secondary | ICD-10-CM | POA: Diagnosis not present

## 2022-11-02 DIAGNOSIS — I10 Essential (primary) hypertension: Secondary | ICD-10-CM | POA: Diagnosis not present

## 2022-11-02 DIAGNOSIS — J439 Emphysema, unspecified: Secondary | ICD-10-CM | POA: Diagnosis not present

## 2022-11-02 DIAGNOSIS — E7800 Pure hypercholesterolemia, unspecified: Secondary | ICD-10-CM | POA: Diagnosis not present

## 2022-11-02 DIAGNOSIS — I700 Atherosclerosis of aorta: Secondary | ICD-10-CM | POA: Diagnosis not present

## 2022-12-05 ENCOUNTER — Telehealth: Payer: Self-pay

## 2022-12-05 NOTE — Telephone Encounter (Signed)
Patient left a voice message 12-05-2022.  Needing refill or samples of mirabegron ER (MYRBETRIQ) 50 MG TB24 tablet   Will be out this week.  Please advise.  Thanks, Helene Kelp

## 2022-12-05 NOTE — Telephone Encounter (Signed)
Called patient and he requested samples be left at the front desk.  1 month supply of samples provided

## 2023-01-20 DIAGNOSIS — I700 Atherosclerosis of aorta: Secondary | ICD-10-CM | POA: Diagnosis not present

## 2023-01-20 DIAGNOSIS — J209 Acute bronchitis, unspecified: Secondary | ICD-10-CM | POA: Diagnosis not present

## 2023-01-20 DIAGNOSIS — J439 Emphysema, unspecified: Secondary | ICD-10-CM | POA: Diagnosis not present

## 2023-02-02 DIAGNOSIS — R69 Illness, unspecified: Secondary | ICD-10-CM | POA: Diagnosis not present

## 2023-02-02 DIAGNOSIS — Z299 Encounter for prophylactic measures, unspecified: Secondary | ICD-10-CM | POA: Diagnosis not present

## 2023-02-02 DIAGNOSIS — J439 Emphysema, unspecified: Secondary | ICD-10-CM | POA: Diagnosis not present

## 2023-02-02 DIAGNOSIS — I10 Essential (primary) hypertension: Secondary | ICD-10-CM | POA: Diagnosis not present

## 2023-02-09 DIAGNOSIS — E669 Obesity, unspecified: Secondary | ICD-10-CM | POA: Diagnosis not present

## 2023-02-09 DIAGNOSIS — J3089 Other allergic rhinitis: Secondary | ICD-10-CM | POA: Diagnosis not present

## 2023-02-09 DIAGNOSIS — Z6837 Body mass index (BMI) 37.0-37.9, adult: Secondary | ICD-10-CM | POA: Diagnosis not present

## 2023-02-09 DIAGNOSIS — R030 Elevated blood-pressure reading, without diagnosis of hypertension: Secondary | ICD-10-CM | POA: Diagnosis not present

## 2023-02-09 DIAGNOSIS — J45991 Cough variant asthma: Secondary | ICD-10-CM | POA: Diagnosis not present

## 2023-02-22 ENCOUNTER — Telehealth: Payer: Self-pay

## 2023-02-22 NOTE — Telephone Encounter (Signed)
Patient called requesting samples of Myrbetriq 50mg .  Samples provided at front desk and patient aware.

## 2023-03-20 DIAGNOSIS — K219 Gastro-esophageal reflux disease without esophagitis: Secondary | ICD-10-CM | POA: Diagnosis not present

## 2023-03-20 DIAGNOSIS — M1990 Unspecified osteoarthritis, unspecified site: Secondary | ICD-10-CM | POA: Diagnosis not present

## 2023-03-20 DIAGNOSIS — N3941 Urge incontinence: Secondary | ICD-10-CM | POA: Diagnosis not present

## 2023-03-20 DIAGNOSIS — N182 Chronic kidney disease, stage 2 (mild): Secondary | ICD-10-CM | POA: Diagnosis not present

## 2023-03-20 DIAGNOSIS — J45909 Unspecified asthma, uncomplicated: Secondary | ICD-10-CM | POA: Diagnosis not present

## 2023-03-20 DIAGNOSIS — Z8249 Family history of ischemic heart disease and other diseases of the circulatory system: Secondary | ICD-10-CM | POA: Diagnosis not present

## 2023-03-20 DIAGNOSIS — E785 Hyperlipidemia, unspecified: Secondary | ICD-10-CM | POA: Diagnosis not present

## 2023-03-20 DIAGNOSIS — I2510 Atherosclerotic heart disease of native coronary artery without angina pectoris: Secondary | ICD-10-CM | POA: Diagnosis not present

## 2023-03-20 DIAGNOSIS — I129 Hypertensive chronic kidney disease with stage 1 through stage 4 chronic kidney disease, or unspecified chronic kidney disease: Secondary | ICD-10-CM | POA: Diagnosis not present

## 2023-03-20 DIAGNOSIS — I700 Atherosclerosis of aorta: Secondary | ICD-10-CM | POA: Diagnosis not present

## 2023-03-20 DIAGNOSIS — N529 Male erectile dysfunction, unspecified: Secondary | ICD-10-CM | POA: Diagnosis not present

## 2023-05-23 ENCOUNTER — Ambulatory Visit: Payer: Medicare HMO | Admitting: Urology

## 2023-05-29 DEATH — deceased

## 2023-06-13 ENCOUNTER — Ambulatory Visit: Payer: Medicare HMO | Admitting: Urology
